# Patient Record
Sex: Female | Born: 1955 | Race: White | Hispanic: No | Smoking: Current every day smoker
Health system: Southern US, Community
[De-identification: ages and names within clinical notes are randomized; demographics above are authoritative.]

## PROBLEM LIST (undated history)

## (undated) DIAGNOSIS — E785 Hyperlipidemia, unspecified: Secondary | ICD-10-CM

## (undated) DIAGNOSIS — Z860100 Personal history of colon polyps, unspecified: Secondary | ICD-10-CM

## (undated) DIAGNOSIS — Z87898 Personal history of other specified conditions: Secondary | ICD-10-CM

## (undated) DIAGNOSIS — Z8719 Personal history of other diseases of the digestive system: Secondary | ICD-10-CM

## (undated) DIAGNOSIS — I10 Essential (primary) hypertension: Secondary | ICD-10-CM

## (undated) DIAGNOSIS — E119 Type 2 diabetes mellitus without complications: Secondary | ICD-10-CM

## (undated) DIAGNOSIS — F329 Major depressive disorder, single episode, unspecified: Secondary | ICD-10-CM

## (undated) DIAGNOSIS — F32A Depression, unspecified: Secondary | ICD-10-CM

## (undated) DIAGNOSIS — Z8249 Family history of ischemic heart disease and other diseases of the circulatory system: Secondary | ICD-10-CM

## (undated) DIAGNOSIS — Z8619 Personal history of other infectious and parasitic diseases: Secondary | ICD-10-CM

## (undated) DIAGNOSIS — Z8639 Personal history of other endocrine, nutritional and metabolic disease: Secondary | ICD-10-CM

## (undated) DIAGNOSIS — Z8659 Personal history of other mental and behavioral disorders: Secondary | ICD-10-CM

## (undated) HISTORY — DX: Personal history of colonic polyps: Z86.010

## (undated) HISTORY — DX: Personal history of other specified conditions: Z87.898

## (undated) HISTORY — DX: Type 2 diabetes mellitus without complications: E11.9

## (undated) HISTORY — DX: Family history of ischemic heart disease and other diseases of the circulatory system: Z82.49

## (undated) HISTORY — DX: Hyperlipidemia, unspecified: E78.5

## (undated) HISTORY — DX: Personal history of other diseases of the digestive system: Z87.19

## (undated) HISTORY — DX: Personal history of colon polyps, unspecified: Z86.0100

## (undated) HISTORY — DX: Essential (primary) hypertension: I10

## (undated) HISTORY — DX: Personal history of other mental and behavioral disorders: Z86.59

## (undated) HISTORY — DX: Major depressive disorder, single episode, unspecified: F32.9

## (undated) HISTORY — DX: Personal history of other endocrine, nutritional and metabolic disease: Z86.39

## (undated) HISTORY — DX: Depression, unspecified: F32.A

## (undated) HISTORY — DX: Personal history of other infectious and parasitic diseases: Z86.19

---

## 1998-08-14 HISTORY — PX: REDUCTION MAMMAPLASTY: SUR839

## 1998-08-14 HISTORY — PX: BREAST BIOPSY: SHX20

## 2007-08-15 HISTORY — PX: INCONTINENCE SURGERY: SHX676

## 2007-08-15 HISTORY — PX: PARTIAL HYSTERECTOMY: SHX80

## 2007-08-15 HISTORY — PX: GALLBLADDER SURGERY: SHX652

## 2008-08-14 HISTORY — PX: BREAST REDUCTION SURGERY: SHX8

## 2010-07-30 ENCOUNTER — Ambulatory Visit: Payer: Self-pay | Admitting: Internal Medicine

## 2015-08-16 LAB — HM PAP SMEAR: HM Pap smear: NORMAL

## 2017-06-24 ENCOUNTER — Telehealth: Payer: Self-pay | Admitting: Family Medicine

## 2017-06-24 NOTE — Telephone Encounter (Signed)
Copied from CRM [id number]. Topic: Appointment Scheduling - Scheduling Inquiry for Clinic >> Jun 20, 2017 11:29 AM Landry MellowFoltz, Melissa J wrote: Reason for CRM: pt called - she was told by Seth Bakerystal Sizemore that Dr Milinda Antisower would accept her as new patient.  This patient is a cousin to Crystal.  Cb number is [phone]. Pt has recently moved from [place] >> Jun 20, 2017 11:42 AM Shon MilletWatlington, Shapale M, CMA wrote: [facility] wants to make sure it's okay with you to schedule this new pt appt. See their note   Yes, that is ok  Thanks

## 2017-06-26 NOTE — Telephone Encounter (Signed)
I called pt and sch 08/20/17.

## 2017-08-20 ENCOUNTER — Ambulatory Visit: Payer: Self-pay | Admitting: Family Medicine

## 2017-08-31 ENCOUNTER — Ambulatory Visit: Payer: BLUE CROSS/BLUE SHIELD | Admitting: Family Medicine

## 2017-08-31 ENCOUNTER — Encounter: Payer: Self-pay | Admitting: Family Medicine

## 2017-08-31 VITALS — BP 110/76 | HR 76 | Temp 98.3°F | Ht 66.0 in | Wt 217.0 lb

## 2017-08-31 DIAGNOSIS — E669 Obesity, unspecified: Secondary | ICD-10-CM | POA: Diagnosis not present

## 2017-08-31 DIAGNOSIS — E1169 Type 2 diabetes mellitus with other specified complication: Secondary | ICD-10-CM | POA: Insufficient documentation

## 2017-08-31 DIAGNOSIS — E6609 Other obesity due to excess calories: Secondary | ICD-10-CM | POA: Insufficient documentation

## 2017-08-31 DIAGNOSIS — F17200 Nicotine dependence, unspecified, uncomplicated: Secondary | ICD-10-CM | POA: Diagnosis not present

## 2017-08-31 DIAGNOSIS — E7800 Pure hypercholesterolemia, unspecified: Secondary | ICD-10-CM

## 2017-08-31 DIAGNOSIS — F418 Other specified anxiety disorders: Secondary | ICD-10-CM

## 2017-08-31 DIAGNOSIS — G4700 Insomnia, unspecified: Secondary | ICD-10-CM | POA: Insufficient documentation

## 2017-08-31 DIAGNOSIS — Z1231 Encounter for screening mammogram for malignant neoplasm of breast: Secondary | ICD-10-CM

## 2017-08-31 DIAGNOSIS — I10 Essential (primary) hypertension: Secondary | ICD-10-CM

## 2017-08-31 DIAGNOSIS — E663 Overweight: Secondary | ICD-10-CM | POA: Insufficient documentation

## 2017-08-31 DIAGNOSIS — E785 Hyperlipidemia, unspecified: Secondary | ICD-10-CM | POA: Insufficient documentation

## 2017-08-31 DIAGNOSIS — F5101 Primary insomnia: Secondary | ICD-10-CM

## 2017-08-31 MED ORDER — ATORVASTATIN CALCIUM 20 MG PO TABS: 20.0000 mg | ORAL_TABLET | Freq: Every day | ORAL | 3 refills | Status: DC

## 2017-08-31 MED ORDER — PAROXETINE HCL 40 MG PO TABS: 80.0000 mg | ORAL_TABLET | Freq: Every day | ORAL | 3 refills | Status: DC

## 2017-08-31 MED ORDER — LOSARTAN POTASSIUM-HCTZ 100-25 MG PO TABS: 1.0000 | ORAL_TABLET | Freq: Every day | ORAL | 3 refills | Status: DC

## 2017-08-31 NOTE — Patient Instructions (Addendum)
Start changing diet  Begin with moving towards drinks without calories (mostly water)  Try to get most of your carbohydrates from produce (with the exception of white potatoes)  Eat less bread/pasta/rice/snack foods/cereals/sweets and other items from the middle of the grocery store (processed carbs)   We will refer you for a mammogram and have you sign a release for old records   Labs and f/u in 1 mo

## 2017-08-31 NOTE — Progress Notes (Signed)
Subjective:    Patient ID: Kathleen Hernandez, female    DOB: [birth year], 62 y.o.   MRN: [id number]  HPI Here to establish for care   From [facility]  She grew up here   Lost her husband 3 y ago  Doing ok - better to be home and around family   Smoker - about 55 y  About 1 ppd  No desire to quit  No copd or lung disease  No heart dz  Had her flu shot in the fall  Never had a pneumonia shot  ? Last tetanus shot  Has had the shingles-but no shot - interested in Shingrix    Hx of hysterectomy  Breast reduction  Bladder sling  ccy   Last colonoscopy 2014 - due for one in 2 years   Used to have acid reflux but better now (no longer on zantac)   Last pap 2017 Has had a hysterectomy  G1P1  Last  mammogram 11/17  Needs to get set up in [facility] lipitor for cholesterol as well as fish oil  Controls it well -last labs in July  Diet is not really healthy   Does not exercise  Busy but no extra  Does not work   Raising her 2 grandkids 10 and 15 - full time  Her son (their parent) - is on drugs    hyzaar for HTN  BP: 110/76  Well controlled  Is actually out of medicines  Last lab in July    Takes paxil -for both depression and anxiety  Out of it for a while  Feels terrible   Remus Loffler - she has to have it  Cannot sleep at all w/o it  Taking 2-3 years  She had tried melatonin (gave her bad dreams)  Did not try anything else   Generally does not feel good-no energy   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 Hyperlipidemia 08/31/2017  . Smoker 08/31/2017  . Hypertension 08/31/2017  . Encounter for screening mammogram for breast cancer 08/31/2017  . Obesity (BMI 30-39.9) 08/31/2017  . Depression with anxiety 08/31/2017  . Insomnia 08/31/2017   Past Medical History:  Diagnosis Date  . Family history of heart murmur   . History of chickenpox   . History  of colon polyps   . History of depression   . History of gastroesophageal reflux (GERD)   . History of hyperlipidemia   . History of urinary incontinence    Past Surgical History:  Procedure Laterality Date  . BREAST BIOPSY  2010  . BREAST REDUCTION SURGERY  2010  . GALLBLADDER SURGERY  2009  . INCONTINENCE SURGERY  2009  . PARTIAL HYSTERECTOMY  2009   Social History   Tobacco Use  . Smoking status: Current Every Day Smoker    Packs/day: 1.00  . Smokeless tobacco: Never Used  Substance Use Topics  . Alcohol use: No    Frequency: Never  . Drug use: No   Family History  Problem Relation Age of Onset  . Arthritis Mother   . Heart attack Mother   . Early death Mother   . Alcohol abuse Father   . Alzheimer's disease Father   . Non-Hodgkin's lymphoma Sister   . Depression Sister   . Alcohol abuse Brother   . COPD Brother   . Heart attack Brother   .  Hyperlipidemia Brother   . Diabetes Maternal Grandmother   . Alcohol abuse Maternal Grandfather   . Alzheimer's disease Paternal Grandmother   . Drug abuse Son    Allergies  Allergen Reactions  . Requip [Ropinirole Hcl] Other (See Comments)    Tongue swelling  . Chantix [Varenicline] Rash   Current Outpatient Medications on File Prior to Visit  Medication Sig Dispense Refill  . aspirin 325 MG tablet Take 325 mg by mouth daily.    . Cholecalciferol (VITAMIN D3) 2000 units TABS Take 1 tablet by mouth daily.    . diphenhydrAMINE (BENADRYL) 25 MG tablet Take 25 mg by mouth daily as needed.    . Omega-3 Fatty Acids (FISH OIL) 1200 MG CAPS Take 1 capsule by mouth daily.    . vitamin E 400 UNIT capsule Take 400 Units by mouth daily.    Marland Kitchen zolpidem (AMBIEN) 10 MG tablet Take 10 mg by mouth at bedtime as needed for sleep.     No current facility-administered medications on file prior to visit.     Review of Systems  Constitutional: Positive for fatigue. Negative for activity change, appetite change, fever and unexpected weight  change.  HENT: Negative for congestion, ear pain, rhinorrhea, sinus pressure and sore throat.   Eyes: Negative for pain, redness and visual disturbance.  Respiratory: Negative for cough, shortness of breath and wheezing.   Cardiovascular: Negative for chest pain and palpitations.  Gastrointestinal: Negative for abdominal pain, blood in stool, constipation and diarrhea.  Endocrine: Negative for polydipsia and polyuria.  Genitourinary: Negative for dysuria, frequency and urgency.  Musculoskeletal: Positive for arthralgias. Negative for back pain and myalgias.  Skin: Negative for pallor and rash.  Allergic/Immunologic: Negative for environmental allergies.  Neurological: Positive for light-headedness. Negative for dizziness, syncope and headaches.  Hematological: Negative for adenopathy. Does not bruise/bleed easily.  Psychiatric/Behavioral: Positive for dysphoric mood and sleep disturbance. Negative for decreased concentration, self-injury and suicidal ideas. The patient is nervous/anxious.        Objective:   Physical Exam  Constitutional: She appears well-developed and well-nourished. No distress.  obese and well appearing   HENT:  Head: Normocephalic and atraumatic.  Right Ear: External ear normal.  Left Ear: External ear normal.  Nose: Nose normal.  Mouth/Throat: Oropharynx is clear and moist.  Eyes: Conjunctivae and EOM are normal. Pupils are equal, round, and reactive to light. Right eye exhibits no discharge. Left eye exhibits no discharge. No scleral icterus.  Neck: Normal range of motion. Neck supple. No JVD present. Carotid bruit is not present. No thyromegaly present.  Cardiovascular: Normal rate, regular rhythm, normal heart sounds and intact distal pulses. Exam reveals no gallop.  Pulmonary/Chest: Effort normal and breath sounds normal. No respiratory distress. She has no wheezes. She has no rales.  Diffusely distant bs   Abdominal: Soft. Bowel sounds are normal. She  exhibits no distension and no mass. There is no tenderness.  Musculoskeletal: She exhibits no edema or tenderness.  Lymphadenopathy:    She has no cervical adenopathy.  Neurological: She is alert. She has normal reflexes. No cranial nerve deficit. She exhibits normal muscle tone. Coordination normal.  Skin: Skin is warm and dry. No rash noted. No erythema. No pallor.  Solar lentigines diffusely Some sks   Psychiatric: Her speech is normal and behavior is normal. Thought content normal. Her mood appears anxious. Her affect is not blunt, not labile and not inappropriate. She does not exhibit a depressed mood.  Pt is generally anxious /  pleasant and talkative           Assessment & Plan:   Problem List Items Addressed This Visit      Cardiovascular and Mediastinum   Hypertension    bp in fair control at this time  BP Readings from Last 1 Encounters:  08/31/17 110/76   No changes needed Disc lifstyle change with low sodium diet and exercise   Refilled losartan hct  Plan labs and f/u in 1 mo      Relevant Medications   aspirin 325 MG tablet   losartan-hydrochlorothiazide (HYZAAR) 100-25 MG tablet   atorvastatin (LIPITOR) 20 MG tablet     Other   Depression with anxiety - Primary    Pt has been out of paroxetine  Refilled today  Having w/d symptoms and inc dep/anx (no SI) Also lost spouse  Reviewed stressors/ coping techniques/symptoms/ support sources/ tx options and side effects in detail today  F/u 1 mo       Relevant Medications   PARoxetine (PAXIL) 40 MG tablet   Encounter for screening mammogram for breast cancer    Ref for mammogram  Enc self breast exams  Will be due for health mt in the summer       Relevant Orders   MM DIGITAL SCREENING BILATERAL   Hyperlipidemia    On atorvastatin  Sub optimal diet  Refilled today - out of it  Rev low sat/trans fat diet  F/u 1 mo lab and visit      Relevant Medications   aspirin 325 MG tablet    losartan-hydrochlorothiazide (HYZAAR) 100-25 MG tablet   atorvastatin (LIPITOR) 20 MG tablet   Insomnia    She has needed ambien for over a year  ?if anx related  Disc sleep hygiene        Obesity (BMI 30-39.9)    Discussed how this problem influences overall health and the risks it imposes  Reviewed plan for weight loss with lower calorie diet (via better food choices and also portion control or program like weight watchers) and exercise building up to or more than 30 minutes 5 days per week including some aerobic activity   Pt plans to stop sugar beverages and work on lower glycemic diet       Smoker    Disc in detail risks of smoking and possible outcomes including copd, vascular/ heart disease, cancer , respiratory and sinus infections  Pt voices understanding She is not interested in quitting at this time

## 2017-09-02 NOTE — Assessment & Plan Note (Signed)
Pt has been out of paroxetine  Refilled today  Having w/d symptoms and inc dep/anx (no SI) Also lost spouse  Reviewed stressors/ coping techniques/symptoms/ support sources/ tx options and side effects in detail today  F/u 1 mo

## 2017-09-02 NOTE — Assessment & Plan Note (Signed)
Disc in detail risks of smoking and possible outcomes including copd, vascular/ heart disease, cancer , respiratory and sinus infections  Pt voices understanding She is not interested in quitting at this time  

## 2017-09-02 NOTE — Assessment & Plan Note (Signed)
Discussed how this problem influences overall health and the risks it imposes  Reviewed plan for weight loss with lower calorie diet (via better food choices and also portion control or program like weight watchers) and exercise building up to or more than 30 minutes 5 days per week including some aerobic activity   Pt plans to stop sugar beverages and work on lower glycemic diet

## 2017-09-02 NOTE — Assessment & Plan Note (Signed)
Ref for mammogram  Enc self breast exams  Will be due for health mt in the summer

## 2017-09-02 NOTE — Assessment & Plan Note (Signed)
On atorvastatin  Sub optimal diet  Refilled today - out of it  Rev low sat/trans fat diet  F/u 1 mo lab and visit

## 2017-09-02 NOTE — Assessment & Plan Note (Signed)
bp in fair control at this time  BP Readings from Last 1 Encounters:  08/31/17 110/76   No changes needed Disc lifstyle change with low sodium diet and exercise   Refilled losartan hct  Plan labs and f/u in 1 mo

## 2017-09-02 NOTE — Assessment & Plan Note (Signed)
She has needed ambien for over a year  ?if anx related  Disc sleep hygiene

## 2017-09-16 ENCOUNTER — Emergency Department: Payer: BLUE CROSS/BLUE SHIELD

## 2017-09-16 ENCOUNTER — Encounter: Payer: Self-pay | Admitting: Emergency Medicine

## 2017-09-16 ENCOUNTER — Other Ambulatory Visit: Payer: Self-pay

## 2017-09-16 ENCOUNTER — Emergency Department
Admission: EM | Admit: 2017-09-16 | Discharge: 2017-09-16 | Disposition: A | Discharge status: Home / Self Care | Payer: BLUE CROSS/BLUE SHIELD | Attending: Emergency Medicine | Admitting: Emergency Medicine

## 2017-09-16 DIAGNOSIS — F17210 Nicotine dependence, cigarettes, uncomplicated: Secondary | ICD-10-CM | POA: Insufficient documentation

## 2017-09-16 DIAGNOSIS — Z79899 Other long term (current) drug therapy: Secondary | ICD-10-CM | POA: Insufficient documentation

## 2017-09-16 DIAGNOSIS — Z7982 Long term (current) use of aspirin: Secondary | ICD-10-CM | POA: Insufficient documentation

## 2017-09-16 DIAGNOSIS — R52 Pain, unspecified: Secondary | ICD-10-CM | POA: Diagnosis present

## 2017-09-16 DIAGNOSIS — J101 Influenza due to other identified influenza virus with other respiratory manifestations: Secondary | ICD-10-CM

## 2017-09-16 DIAGNOSIS — I10 Essential (primary) hypertension: Secondary | ICD-10-CM | POA: Diagnosis not present

## 2017-09-16 LAB — CBC
HCT: 45.8 % (ref 35.0–47.0)
Hemoglobin: 15.7 g/dL (ref 12.0–16.0)
MCH: 30.8 pg (ref 26.0–34.0)
MCHC: 34.2 g/dL (ref 32.0–36.0)
MCV: 90 fL (ref 80.0–100.0)
PLATELETS: 296 10*3/uL (ref 150–440)
RBC: 5.09 MIL/uL (ref 3.80–5.20)
RDW: 12.6 % (ref 11.5–14.5)
WBC: 5.3 10*3/uL (ref 3.6–11.0)

## 2017-09-16 LAB — COMPREHENSIVE METABOLIC PANEL
ALBUMIN: 4 g/dL (ref 3.5–5.0)
ALT: 23 U/L (ref 14–54)
AST: 33 U/L (ref 15–41)
Alkaline Phosphatase: 97 U/L (ref 38–126)
Anion gap: 12 (ref 5–15)
BUN: 27 mg/dL — ABNORMAL HIGH (ref 6–20)
CHLORIDE: 95 mmol/L — AB (ref 101–111)
CO2: 24 mmol/L (ref 22–32)
CREATININE: 1.4 mg/dL — AB (ref 0.44–1.00)
Calcium: 9.2 mg/dL (ref 8.9–10.3)
GFR calc non Af Amer: 40 mL/min — ABNORMAL LOW (ref >60)
GFR, EST AFRICAN AMERICAN: 46 mL/min — AB (ref >60)
GLUCOSE: 114 mg/dL — AB (ref 65–99)
Potassium: 3.3 mmol/L — ABNORMAL LOW (ref 3.5–5.1)
SODIUM: 131 mmol/L — AB (ref 135–145)
Total Bilirubin: 0.5 mg/dL (ref 0.3–1.2)
Total Protein: 7.6 g/dL (ref 6.5–8.1)

## 2017-09-16 LAB — INFLUENZA PANEL BY PCR (TYPE A & B)
INFLAPCR: POSITIVE — AB
INFLBPCR: NEGATIVE

## 2017-09-16 MED ORDER — ONDANSETRON 4 MG PO TBDP: 4.0000 mg | ORAL_TABLET | Freq: Three times a day (TID) | ORAL | 0 refills | Status: DC | PRN

## 2017-09-16 MED ORDER — SODIUM CHLORIDE 0.9 % IV SOLN
1000.0000 mL | Freq: Once | INTRAVENOUS | Status: AC
  Administered 2017-09-16: 1000 mL via INTRAVENOUS

## 2017-09-16 MED ORDER — ONDANSETRON HCL 4 MG/2ML IJ SOLN
INTRAMUSCULAR | Status: AC
  Administered 2017-09-16: 4 mg via INTRAVENOUS
  Filled 2017-09-16: qty 2

## 2017-09-16 MED ORDER — ONDANSETRON HCL 4 MG/2ML IJ SOLN
4.0000 mg | Freq: Once | INTRAMUSCULAR | Status: AC
  Administered 2017-09-16: 4 mg via INTRAVENOUS

## 2017-09-16 NOTE — ED Provider Notes (Signed)
[facility] Emergency Department Provider Note   ____________________________________________    I have reviewed the triage vital signs and the nursing notes.   HISTORY  Chief Complaint Generalized Body Aches; Nausea; and Diarrhea     HPI Kathleen Hernandez is a 62 y.o. female presents with complaints of body aches, nausea vomiting and diarrhea over the last 3 days.  Symptoms apparently started on Thursday where she started to feel fatigued with body aches primarily in her back.  She developed nausea and vomiting on Friday and Saturday.  Today she feels "wiped out ".  She has not taken anything for this except for acetaminophen with little improvement.  She has had fevers at home.  She did receive a flu shot this year.  No recent travel.  No sick contacts reported   Past Medical History:  Diagnosis Date  . Family history of heart murmur   . History of chickenpox   . History of colon polyps   . History of depression   . History of gastroesophageal reflux (GERD)   . History of hyperlipidemia   . History of urinary incontinence     Patient Active Problem List   Diagnosis Date Noted  . Hyperlipidemia 08/31/2017  . Smoker 08/31/2017  . Hypertension 08/31/2017  . Encounter for screening mammogram for breast cancer 08/31/2017  . Obesity (BMI 30-39.9) 08/31/2017  . Depression with anxiety 08/31/2017  . Insomnia 08/31/2017    Past Surgical History:  Procedure Laterality Date  . BREAST BIOPSY  2010  . BREAST REDUCTION SURGERY  2010  . GALLBLADDER SURGERY  2009  . INCONTINENCE SURGERY  2009  . PARTIAL HYSTERECTOMY  2009    Prior to Admission medications   Medication Sig Start Date End Date Taking? Authorizing Provider  aspirin 325 MG tablet Take 325 mg by mouth daily.    [provider]  atorvastatin (LIPITOR) 20 MG tablet Take 1 tablet (20 mg total) by mouth daily. 08/31/17   Tower, Audrie Gallus, MD  Cholecalciferol (VITAMIN D3) 2000 units  TABS Take 1 tablet by mouth daily.    [provider]  diphenhydrAMINE (BENADRYL) 25 MG tablet Take 25 mg by mouth daily as needed.    [provider]  losartan-hydrochlorothiazide (HYZAAR) 100-25 MG tablet Take 1 tablet by mouth daily. 08/31/17   Tower, Audrie Gallus, MD  Omega-3 Fatty Acids (FISH OIL) 1200 MG CAPS Take 1 capsule by mouth daily.    [provider]  ondansetron (ZOFRAN ODT) 4 MG disintegrating tablet Take 1 tablet (4 mg total) by mouth every 8 (eight) hours as needed for nausea or vomiting. 09/16/17   Jene Every, MD  PARoxetine (PAXIL) 40 MG tablet Take 2 tablets (80 mg total) by mouth daily. 08/31/17   Tower, Audrie Gallus, MD  vitamin E 400 UNIT capsule Take 400 Units by mouth daily.    [provider]  zolpidem (AMBIEN) 10 MG tablet Take 10 mg by mouth at bedtime as needed for sleep.    [provider]     Allergies Requip [ropinirole hcl] and Chantix [varenicline]  Family History  Problem Relation Age of Onset  . Arthritis Mother   . Heart attack Mother   . Early death Mother   . Alcohol abuse Father   . Alzheimer's disease Father   . Non-Hodgkin's lymphoma Sister   . Depression Sister   . Alcohol abuse Brother   . COPD Brother   . Heart attack Brother   .  Hyperlipidemia Brother   . Diabetes Maternal Grandmother   . Alcohol abuse Maternal Grandfather   . Alzheimer's disease Paternal Grandmother   . Drug abuse Son     Social History Social History   Tobacco Use  . Smoking status: Current Every Day Smoker    Packs/day: 1.00  . Smokeless tobacco: Never Used  Substance Use Topics  . Alcohol use: No    Frequency: Never  . Drug use: No    Review of Systems  Constitutional: Positive fevers Eyes: No visual changes.  ENT: No sore throat. Cardiovascular: Denies chest pain. Respiratory: Positive cough Gastrointestinal: Nausea vomiting and diarrhea as above Genitourinary: Negative for dysuria. Musculoskeletal: Positive  myalgias Skin: Negative for rash. Neurological: Positive headache   ____________________________________________   PHYSICAL EXAM:  VITAL SIGNS: ED Triage Vitals  Enc Vitals Group     BP 09/16/17 1246 92/67     Pulse Rate 09/16/17 1246 88     Resp 09/16/17 1246 20     Temp 09/16/17 1246 97.8 F (36.6 C)     Temp Source 09/16/17 1246 Oral     SpO2 09/16/17 1246 95 %     Weight 09/16/17 1247 97.5 kg (215 lb)     Height 09/16/17 1247 1.676 m (5\' 6" )     Head Circumference --      Peak Flow --      Pain Score 09/16/17 1247 9     Pain Loc --      Pain Edu? --      Excl. in GC? --     Constitutional: Alert and oriented. No acute distress.  Eyes: Conjunctivae are normal.   Nose: No congestion/rhinnorhea. Mouth/Throat: Mucous membranes are moist.    Cardiovascular: Normal rate, regular rhythm. Grossly normal heart sounds.  Good peripheral circulation. Respiratory: Normal respiratory effort.  No retractions. Lungs CTAB. Gastrointestinal: Soft and nontender. No distention.   Genitourinary: deferred Musculoskeletal: No lower extremity tenderness nor edema.  Warm and well perfused Neurologic:  Normal speech and language. No gross focal neurologic deficits are appreciated.  Skin:  Skin is warm, dry and intact. No rash noted. Psychiatric: Mood and affect are normal. Speech and behavior are normal.  ____________________________________________   LABS (all labs ordered are listed, but only abnormal results are displayed)  Labs Reviewed  INFLUENZA PANEL BY PCR (TYPE A & B) - Abnormal; Notable for the following components:      Result Value   Influenza A By PCR POSITIVE (*)    All other components within normal limits  COMPREHENSIVE METABOLIC PANEL - Abnormal; Notable for the following components:   Sodium 131 (*)    Potassium 3.3 (*)    Chloride 95 (*)    Glucose, Bld 114 (*)    BUN 27 (*)    Creatinine, Ser 1.40 (*)    GFR calc non Af Amer 40 (*)    GFR calc Af Amer 46  (*)    All other components within normal limits  CBC   ____________________________________________  EKG  None ____________________________________________  RADIOLOGY  Chest x-ray negative for pneumonia ____________________________________________   PROCEDURES  Procedure(s) performed: No  Procedures   Critical Care performed: No ____________________________________________   INITIAL IMPRESSION / ASSESSMENT AND PLAN / ED COURSE  Pertinent labs & imaging results that were available during my care of the patient were reviewed by me and considered in my medical decision making (see chart for details).  Patient presents with symptoms as noted above, suspicious for viral  illness, possibly influenza.  Chest x-ray is reassuring, overall exam is also reassuring.  We will check labs, give IV fluids, IV Zofran, check influenza and reevaluate  Clinical Course as of Sep 16 1544  Sun Sep 16, 2017  1438 Influenza A By PCR: (!) POSITIVE [RK]    Clinical Course User Index [RK] Jene Every, MD    Patient felt significantly better after fluids, lab work shows positive influenza as well as some dehydration ____________________________________________   FINAL CLINICAL IMPRESSION(S) / ED DIAGNOSES  Final diagnoses:  Influenza A        Note:  This document was prepared using Dragon voice recognition software and may include unintentional dictation errors.    Jene Every, MD 09/16/17 [phone]

## 2017-09-16 NOTE — ED Triage Notes (Signed)
Presents to ER from home with complaints of generalized body aches, fever at home as high as 101F, nausea this morning no episodes of vomiting , diarrhea all day about 5 episodes today . Unable to eat or drink, dry cough, reports upper back pain.

## 2017-09-16 NOTE — ED Notes (Signed)
Discussed with Dr. Fanny BienQuale about pt's presentation received verbal order to get a chest X-ray. RN acknowledge orders.

## 2017-09-16 NOTE — ED Notes (Signed)
Patient to lobby in wheelchair. Verbalized understanding of discharge instructions and follow-up care.  

## 2017-09-21 ENCOUNTER — Ambulatory Visit
Admission: RE | Admit: 2017-09-21 | Discharge: 2017-09-21 | Disposition: A | Discharge status: Home / Self Care | Payer: BLUE CROSS/BLUE SHIELD | Source: Ambulatory Visit | Attending: Family Medicine | Admitting: Family Medicine

## 2017-09-21 DIAGNOSIS — Z1231 Encounter for screening mammogram for malignant neoplasm of breast: Secondary | ICD-10-CM | POA: Diagnosis present

## 2017-09-26 ENCOUNTER — Encounter: Payer: Self-pay | Admitting: Family Medicine

## 2017-09-26 ENCOUNTER — Ambulatory Visit: Payer: BLUE CROSS/BLUE SHIELD | Admitting: Family Medicine

## 2017-09-26 ENCOUNTER — Ambulatory Visit (INDEPENDENT_AMBULATORY_CARE_PROVIDER_SITE_OTHER)
Admission: RE | Admit: 2017-09-26 | Discharge: 2017-09-26 | Disposition: A | Discharge status: Home / Self Care | Payer: BLUE CROSS/BLUE SHIELD | Source: Ambulatory Visit | Attending: Family Medicine | Admitting: Family Medicine

## 2017-09-26 ENCOUNTER — Other Ambulatory Visit: Payer: Self-pay

## 2017-09-26 VITALS — BP 100/70 | HR 63 | Temp 98.6°F | Ht 66.0 in | Wt 211.5 lb

## 2017-09-26 DIAGNOSIS — M25562 Pain in left knee: Secondary | ICD-10-CM

## 2017-09-26 NOTE — Progress Notes (Signed)
Dr. Karleen HampshireSpencer T. Analysa Nutting, MD, CAQ Sports [facility] [address] [place], [id number] Phone: [phone] Fax: [phone]  09/26/2017  Patient: Kathleen Hernandez Brubeck, MRN: [id number], DOB: 12/06/1955, 62 y.o.  Primary Physician:  Tower, Audrie GallusMarne A, MD   Chief Complaint  Patient presents with  . Fall    Missed stair and came down on knee 2 weeks ago  . Knee Pain    Left   Subjective:   Kathleen Hernandez Fannin is Hernandez 62 y.o. very pleasant female patient who presents with the following:  2 weeks ago, came down on concrete. Down 1 stair.  At that point she struck the anterior aspect of her knee.  She is walking reasonably okay, but that she initially did have some limp.  She does have some bruising, more on the lateral aspect of the knee.  No prior history of operative intervention in the affected knee.  Past Medical History, Surgical History, Social History, Family History, Problem List, Medications, and Allergies have been reviewed and updated if relevant.  Patient Active Problem List   Diagnosis Date Noted  . Hyperlipidemia 08/31/2017  . Smoker 08/31/2017  . Hypertension 08/31/2017  . Encounter for screening mammogram for breast cancer 08/31/2017  . Obesity (BMI 30-39.9) 08/31/2017  . Depression with anxiety 08/31/2017  . Insomnia 08/31/2017    Past Medical History:  Diagnosis Date  . Family history of heart murmur   . History of chickenpox   . History of colon polyps   . History of depression   . History of gastroesophageal reflux (GERD)   . History of hyperlipidemia   . History of urinary incontinence     Past Surgical History:  Procedure Laterality Date  . BREAST BIOPSY Right 2000   neg  . BREAST REDUCTION SURGERY  2010  . GALLBLADDER SURGERY  2009  . INCONTINENCE SURGERY  2009  . PARTIAL HYSTERECTOMY  2009  . REDUCTION MAMMAPLASTY Bilateral 2000    Social History   Socioeconomic History  . Marital status: Married    Spouse name:  Not on file  . Number of children: Not on file  . Years of education: Not on file  . Highest education level: Not on file  Social Needs  . Financial resource strain: Not on file  . Food insecurity - worry: Not on file  . Food insecurity - inability: Not on file  . Transportation needs - medical: Not on file  . Transportation needs - non-medical: Not on file  Occupational History  . Not on file  Tobacco Use  . Smoking status: Current Every Day Smoker    Packs/day: 1.00  . Smokeless tobacco: Never Used  Substance and Sexual Activity  . Alcohol use: No    Frequency: Never  . Drug use: No  . Sexual activity: Not on file  Other Topics Concern  . Not on file  Social History Narrative  . Not on file    Family History  Problem Relation Age of Onset  . Arthritis Mother   . Heart attack Mother   . Early death Mother   . Alcohol abuse Father   . Alzheimer's disease Father   . Non-Hodgkin's lymphoma Sister   . Depression Sister   . Alcohol abuse Brother   . COPD Brother   . Heart attack Brother   . Hyperlipidemia Brother   . Diabetes Maternal Grandmother   . Alcohol abuse Maternal Grandfather   . Alzheimer's disease Paternal Grandmother   .  Drug abuse Son     Allergies  Allergen Reactions  . Requip [Ropinirole Hcl] Other (See Comments)    Tongue swelling  . Chantix [Varenicline] Rash    Medication list reviewed and updated in full in [facility].  GEN: No fevers, chills. Nontoxic. Primarily MSK c/o today. MSK: Detailed in the HPI GI: tolerating PO intake without difficulty Neuro: No numbness, parasthesias, or tingling associated. Otherwise the pertinent positives of the ROS are noted above.   Objective:   BP 100/70   Pulse 63   Temp 98.6 F (37 C) (Oral)   Ht 5\' 6"  (1.676 m)   Wt 211 lb 8 oz (95.9 kg)   BMI 34.14 kg/m    GEN: WDWN, NAD, Non-toxic, Alert & Oriented x 3 HEENT: Atraumatic, Normocephalic.  Ears and Nose: No external deformity. EXTR: No  clubbing/cyanosis/edema NEURO: Normal gait.  PSYCH: Normally interactive. Conversant. Not depressed or anxious appearing.  Calm demeanor.    Left knee: Patient has full extension and flexion 225 degrees.  There is no significant effusion.  Nontender at the patella facets.  Negative apprehension.  MCL and LCL are stable.  ACL and PCL are intact.  No significant medial joint line tenderness.  The patient does have some modest lateral joint line tenderness as well as some pain at the lateral knee to palpation.  No significant tenderness at the proximal tibia, tibial plateau, or the proximal for  Radiology: Dg Knee Complete 4 Views Left  Result Date: 09/26/2017 CLINICAL DATA:  Recent fall with left knee pain, initial encounter EXAM: LEFT KNEE - COMPLETE 4+ VIEW COMPARISON:  None. FINDINGS: No evidence of fracture, dislocation, or joint effusion. No evidence of arthropathy or other focal bone abnormality. Soft tissues are unremarkable. IMPRESSION: No acute abnormality noted. Electronically Signed   By: Alcide Clever M.D.   On: 09/26/2017 09:11   Assessment and Plan:   Acute pain of left knee - Plan: DG Knee Complete 4 Views Left  Most likely traumatic bone contusion, with possible meniscal contusion.  Good tissue with some intermittent icing, over-the-counter Tylenol or Advil is perfectly fine.  I gave her Hernandez patellar J brace for stability when ambulating.  I am going to recheck her in the office in 3 weeks.  Follow-up: Return in about 3 weeks (around 10/17/2017).  Medications Discontinued During This Encounter  Medication Reason  . ondansetron (ZOFRAN ODT) 4 MG disintegrating tablet No longer needed (for PRN medications)   Orders Placed This Encounter  Procedures  . DG Knee Complete 4 Views Left    Signed,  Katisha Shimizu T. Sheyli Horwitz, MD   Allergies as of 09/26/2017      Reactions   Requip [ropinirole Hcl] Other (See Comments)   Tongue swelling   Chantix [varenicline] Rash      Medication  List        Accurate as of 09/26/17 11:59 PM. Always use your most recent med list.          aspirin 325 MG tablet Take 325 mg by mouth daily.   atorvastatin 20 MG tablet Commonly known as:  LIPITOR Take 1 tablet (20 mg total) by mouth daily.   diphenhydrAMINE 25 MG tablet Commonly known as:  BENADRYL Take 25 mg by mouth daily as needed.   Fish Oil 1200 MG Caps Take 1 capsule by mouth daily.   losartan-hydrochlorothiazide 100-25 MG tablet Commonly known as:  HYZAAR Take 1 tablet by mouth daily.   PARoxetine 40 MG tablet Commonly known  as:  PAXIL Take 2 tablets (80 mg total) by mouth daily.   Vitamin D3 2000 units Tabs Take 1 tablet by mouth daily.   vitamin E 400 UNIT capsule Take 400 Units by mouth daily.   zolpidem 10 MG tablet Commonly known as:  AMBIEN Take 10 mg by mouth at bedtime as needed for sleep.

## 2017-10-01 ENCOUNTER — Other Ambulatory Visit (INDEPENDENT_AMBULATORY_CARE_PROVIDER_SITE_OTHER): Payer: BLUE CROSS/BLUE SHIELD

## 2017-10-01 DIAGNOSIS — I10 Essential (primary) hypertension: Secondary | ICD-10-CM

## 2017-10-01 DIAGNOSIS — E7800 Pure hypercholesterolemia, unspecified: Secondary | ICD-10-CM

## 2017-10-01 LAB — COMPREHENSIVE METABOLIC PANEL
ALT: 10 U/L (ref 0–35)
AST: 9 U/L (ref 0–37)
Albumin: 3.9 g/dL (ref 3.5–5.2)
Alkaline Phosphatase: 95 U/L (ref 39–117)
BUN: 18 mg/dL (ref 6–23)
CO2: 30 meq/L (ref 19–32)
Calcium: 8.9 mg/dL (ref 8.4–10.5)
Chloride: 102 mEq/L (ref 96–112)
Creatinine, Ser: 0.74 mg/dL (ref 0.40–1.20)
GFR: 84.67 mL/min (ref >60.00)
GLUCOSE: 111 mg/dL — AB (ref 70–99)
POTASSIUM: 4.3 meq/L (ref 3.5–5.1)
Sodium: 139 mEq/L (ref 135–145)
Total Bilirubin: 0.3 mg/dL (ref 0.2–1.2)
Total Protein: 6.8 g/dL (ref 6.0–8.3)

## 2017-10-01 LAB — CBC WITH DIFFERENTIAL/PLATELET
Basophils Absolute: 0.1 10*3/uL (ref 0.0–0.1)
Basophils Relative: 0.6 % (ref 0.0–3.0)
EOS PCT: 2.7 % (ref 0.0–5.0)
Eosinophils Absolute: 0.3 10*3/uL (ref 0.0–0.7)
HCT: 41.5 % (ref 36.0–46.0)
Hemoglobin: 13.9 g/dL (ref 12.0–15.0)
LYMPHS ABS: 2.2 10*3/uL (ref 0.7–4.0)
Lymphocytes Relative: 23.6 % (ref 12.0–46.0)
MCHC: 33.5 g/dL (ref 30.0–36.0)
MCV: 92.7 fl (ref 78.0–100.0)
Monocytes Absolute: 0.6 10*3/uL (ref 0.1–1.0)
Monocytes Relative: 6.8 % (ref 3.0–12.0)
NEUTROS ABS: 6.3 10*3/uL (ref 1.4–7.7)
NEUTROS PCT: 66.3 % (ref 43.0–77.0)
PLATELETS: 491 10*3/uL — AB (ref 150.0–400.0)
RBC: 4.48 Mil/uL (ref 3.87–5.11)
RDW: 12.9 % (ref 11.5–15.5)
WBC: 9.4 10*3/uL (ref 4.0–10.5)

## 2017-10-01 LAB — LIPID PANEL
CHOL/HDL RATIO: 3
Cholesterol: 145 mg/dL (ref 0–200)
HDL: 44.9 mg/dL (ref >39.00)
LDL Cholesterol: 79 mg/dL (ref 0–99)
NonHDL: 100.37
Triglycerides: 106 mg/dL (ref 0.0–149.0)
VLDL: 21.2 mg/dL (ref 0.0–40.0)

## 2017-10-01 LAB — TSH: TSH: 3.17 u[IU]/mL (ref 0.35–4.50)

## 2017-10-08 ENCOUNTER — Ambulatory Visit: Payer: BLUE CROSS/BLUE SHIELD | Admitting: Family Medicine

## 2017-10-08 ENCOUNTER — Encounter: Payer: Self-pay | Admitting: Family Medicine

## 2017-10-08 VITALS — BP 126/68 | HR 73 | Temp 97.5°F | Ht 66.0 in | Wt 213.0 lb

## 2017-10-08 DIAGNOSIS — F418 Other specified anxiety disorders: Secondary | ICD-10-CM | POA: Diagnosis not present

## 2017-10-08 DIAGNOSIS — R7309 Other abnormal glucose: Secondary | ICD-10-CM

## 2017-10-08 DIAGNOSIS — R7989 Other specified abnormal findings of blood chemistry: Secondary | ICD-10-CM

## 2017-10-08 DIAGNOSIS — I10 Essential (primary) hypertension: Secondary | ICD-10-CM | POA: Diagnosis not present

## 2017-10-08 DIAGNOSIS — Z23 Encounter for immunization: Secondary | ICD-10-CM

## 2017-10-08 DIAGNOSIS — E669 Obesity, unspecified: Secondary | ICD-10-CM | POA: Diagnosis not present

## 2017-10-08 DIAGNOSIS — F17200 Nicotine dependence, unspecified, uncomplicated: Secondary | ICD-10-CM

## 2017-10-08 DIAGNOSIS — E7800 Pure hypercholesterolemia, unspecified: Secondary | ICD-10-CM

## 2017-10-08 NOTE — Assessment & Plan Note (Signed)
Disc in detail risks of smoking and possible outcomes including copd, vascular/ heart disease, cancer , respiratory and sinus infections  Pt voices understanding She does not have the desire to quit

## 2017-10-08 NOTE — Assessment & Plan Note (Signed)
At 491 No symptoms /no bleeding or bruising  May be related to smoking Nl cbc otherwise Will watch  Re check at f/u

## 2017-10-08 NOTE — Assessment & Plan Note (Signed)
bp in fair control at this time  BP Readings from Last 1 Encounters:  10/08/17 126/68   No changes needed Disc lifstyle change with low sodium diet and exercise  Labs reviewed  Continue losartan hct

## 2017-10-08 NOTE — Assessment & Plan Note (Signed)
Discussed how this problem influences overall health and the risks it imposes  Reviewed plan for weight loss with lower calorie diet (via better food choices and also portion control or program like weight watchers) and exercise building up to or more than 30 minutes 5 days per week including some aerobic activity    

## 2017-10-08 NOTE — Assessment & Plan Note (Signed)
Much improved after re starting ssri

## 2017-10-08 NOTE — Assessment & Plan Note (Signed)
Disc goals for lipids and reasons to control them Rev labs with pt Rev low sat fat diet in detail Good control with atrovastatin and diet

## 2017-10-08 NOTE — Progress Notes (Signed)
Subjective:    Patient ID: Kathleen Hernandez, female    DOB: Jul 26, 1956, 62 y.o.   MRN: [id number]  HPI Here for f/u of mood and other chronic problems   Feeling good   Wt Readings from Last 3 Encounters:  10/08/17 213 lb (96.6 kg)  09/26/17 211 lb 8 oz (95.9 kg)  09/16/17 215 lb (97.5 kg)  wt is up 2lb  34.38 kg/m   Smoking status  No change  No desire to quit  No lung problems or couh   bp is stable today  No cp or palpitations or headaches or edema  No side effects to medicines  BP Readings from Last 3 Encounters:  10/08/17 126/68  09/26/17 100/70  09/16/17 100/61     Taking losartan/hct  Lab Results  Component Value Date   CREATININE 0.74 10/01/2017   BUN 18 10/01/2017   NA 139 10/01/2017   K 4.3 10/01/2017   CL 102 10/01/2017   CO2 30 10/01/2017   Lab Results  Component Value Date   ALT 10 10/01/2017   AST 9 10/01/2017   ALKPHOS 95 10/01/2017   BILITOT 0.3 10/01/2017   Lab Results  Component Value Date   WBC 9.4 10/01/2017   HGB 13.9 10/01/2017   HCT 41.5 10/01/2017   MCV 92.7 10/01/2017   PLT 491.0 (H) 10/01/2017   glucose 111  She dropped tea and soda - drinks almost all water occ coffee  Getting used to drinking water   Lab Results  Component Value Date   TSH 3.17 10/01/2017     Hyperlipidemia Lab Results  Component Value Date   CHOL 145 10/01/2017   HDL 44.90 10/01/2017   LDLCALC 79 10/01/2017   TRIG 106.0 10/01/2017   CHOLHDL 3 10/01/2017   On atorvastatin and diet  Staying at goal   Mood= depression and anxiety  Started back on paxil at last visit -much better /getting back to her old self  [facility] for sleep  Will get Tdap today   Had mammogram 2 weeks ago  No lumps on self exam   Gets colonoscopy every 5 years  Will send for these reports  Patient Active Problem List   Diagnosis Date Noted  . Elevated glucose level 10/08/2017  . Elevated platelet count 10/08/2017  . Hyperlipidemia 08/31/2017  . Smoker  08/31/2017  . Hypertension 08/31/2017  . Encounter for screening mammogram for breast cancer 08/31/2017  . Obesity (BMI 30-39.9) 08/31/2017  . Depression with anxiety 08/31/2017  . Insomnia 08/31/2017   Past Medical History:  Diagnosis Date  . Family history of heart murmur   . History of chickenpox   . History of colon polyps   . History of depression   . History of gastroesophageal reflux (GERD)   . History of hyperlipidemia   . History of urinary incontinence    Past Surgical History:  Procedure Laterality Date  . BREAST BIOPSY Right 2000   neg  . BREAST REDUCTION SURGERY  2010  . GALLBLADDER SURGERY  2009  . INCONTINENCE SURGERY  2009  . PARTIAL HYSTERECTOMY  2009  . REDUCTION MAMMAPLASTY Bilateral 2000   Social History   Tobacco Use  . Smoking status: Current Every Day Smoker    Packs/day: 1.00  . Smokeless tobacco: Never Used  Substance Use Topics  . Alcohol use: No    Frequency: Never  . Drug use: No   Family History  Problem Relation Age of Onset  . Arthritis Mother   .  Heart attack Mother   . Early death Mother   . Alcohol abuse Father   . Alzheimer's disease Father   . Non-Hodgkin's lymphoma Sister   . Depression Sister   . Alcohol abuse Brother   . COPD Brother   . Heart attack Brother   . Hyperlipidemia Brother   . Diabetes Maternal Grandmother   . Alcohol abuse Maternal Grandfather   . Alzheimer's disease Paternal Grandmother   . Drug abuse Son    Allergies  Allergen Reactions  . Requip [Ropinirole Hcl] Other (See Comments)    Tongue swelling  . Chantix [Varenicline] Rash   Current Outpatient Medications on File Prior to Visit  Medication Sig Dispense Refill  . aspirin 325 MG tablet Take 325 mg by mouth daily.    Marland Kitchen atorvastatin (LIPITOR) 20 MG tablet Take 1 tablet (20 mg total) by mouth daily. 90 tablet 3  . Cholecalciferol (VITAMIN D3) 2000 units TABS Take 1 tablet by mouth daily.    . diphenhydrAMINE (BENADRYL) 25 MG tablet Take 25 mg  by mouth daily as needed.    Marland Kitchen losartan-hydrochlorothiazide (HYZAAR) 100-25 MG tablet Take 1 tablet by mouth daily. 90 tablet 3  . Omega-3 Fatty Acids (FISH OIL) 1200 MG CAPS Take 1 capsule by mouth daily.    Marland Kitchen PARoxetine (PAXIL) 40 MG tablet Take 2 tablets (80 mg total) by mouth daily. 180 tablet 3  . vitamin E 400 UNIT capsule Take 400 Units by mouth daily.    Marland Kitchen zolpidem (AMBIEN) 10 MG tablet Take 10 mg by mouth at bedtime as needed for sleep.     No current facility-administered medications on file prior to visit.      Review of Systems  Constitutional: Negative for activity change, appetite change, fatigue, fever and unexpected weight change.  HENT: Negative for congestion, rhinorrhea, sore throat and trouble swallowing.   Eyes: Negative for pain, redness, itching and visual disturbance.  Respiratory: Negative for cough, chest tightness, shortness of breath and wheezing.   Cardiovascular: Negative for chest pain and palpitations.  Gastrointestinal: Negative for abdominal pain, blood in stool, constipation, diarrhea and nausea.  Endocrine: Negative for cold intolerance, heat intolerance, polydipsia and polyuria.  Genitourinary: Negative for difficulty urinating, dysuria, frequency and urgency.  Musculoskeletal: Negative for arthralgias, joint swelling and myalgias.  Skin: Negative for pallor and rash.  Neurological: Negative for dizziness, tremors, weakness, numbness and headaches.  Hematological: Negative for adenopathy. Does not bruise/bleed easily.  Psychiatric/Behavioral: Negative for decreased concentration and dysphoric mood. The patient is not nervous/anxious.        Mood is improved        Objective:   Physical Exam  Constitutional: She appears well-developed and well-nourished. No distress.  obese and well appearing   HENT:  Head: Normocephalic and atraumatic.  Mouth/Throat: Oropharynx is clear and moist.  Eyes: Conjunctivae and EOM are normal. Pupils are equal, round,  and reactive to light.  Neck: Normal range of motion. Neck supple. No JVD present. Carotid bruit is not present. No thyromegaly present.  Cardiovascular: Normal rate, regular rhythm, normal heart sounds and intact distal pulses. Exam reveals no gallop.  Pulmonary/Chest: Effort normal and breath sounds normal. No respiratory distress. She has no wheezes. She has no rales.  No crackles  Diffusely distant bs  No wheeze   Abdominal: Soft. Bowel sounds are normal. She exhibits no distension, no abdominal bruit and no mass. There is no tenderness.  Musculoskeletal: She exhibits no edema.  Lymphadenopathy:    She  has no cervical adenopathy.  Neurological: She is alert. She has normal reflexes. No cranial nerve deficit. She exhibits normal muscle tone. Coordination normal.  Skin: Skin is warm and dry. No rash noted. No pallor.  No bruising   Psychiatric: She has a normal mood and affect. Her mood appears not anxious. She does not exhibit a depressed mood.          Assessment & Plan:

## 2017-10-08 NOTE — Assessment & Plan Note (Signed)
Will watch this closely  disc imp of low glycemic diet and wt loss to prevent DM2  F/u 6 mo

## 2017-10-08 NOTE — Patient Instructions (Addendum)
We need to keep an eye on your blood sugar  You are at risk for diabetes  Weight loss and better diet and exercise can help  Try to get most of your carbohydrates from produce (with the exception of white potatoes)  Eat less bread/pasta/rice/snack foods/cereals/sweets and other items from the middle of the grocery store (processed carbs)   Think about quitting smoking    We will send for mammogram and colonoscopy report    Tdap shot today  Pneumonia vaccine today as well   F/u 6 mo (blood sugar and platelets-lets re check)

## 2017-10-12 ENCOUNTER — Encounter: Payer: Self-pay | Admitting: Family Medicine

## 2017-10-12 MED ORDER — ZOLPIDEM TARTRATE 10 MG PO TABS: 10.0000 mg | ORAL_TABLET | Freq: Every evening | ORAL | 0 refills | Status: DC | PRN

## 2017-10-12 NOTE — Telephone Encounter (Signed)
Pt called for refill of Remus Lofflerambien  Last refilled in December  Checked the [employer] data base

## 2017-10-17 ENCOUNTER — Ambulatory Visit: Payer: BLUE CROSS/BLUE SHIELD | Admitting: Family Medicine

## 2017-10-23 ENCOUNTER — Other Ambulatory Visit: Payer: Self-pay | Admitting: *Deleted

## 2017-10-23 ENCOUNTER — Inpatient Hospital Stay
Admission: RE | Admit: 2017-10-23 | Discharge: 2017-10-23 | Disposition: A | Discharge status: Home / Self Care | Payer: Self-pay | Source: Ambulatory Visit | Attending: *Deleted | Admitting: *Deleted

## 2017-10-23 DIAGNOSIS — Z9289 Personal history of other medical treatment: Secondary | ICD-10-CM

## 2018-02-28 ENCOUNTER — Other Ambulatory Visit: Payer: Self-pay | Admitting: Family Medicine

## 2018-02-28 MED ORDER — ZOLPIDEM TARTRATE 10 MG PO TABS: 10.0000 mg | ORAL_TABLET | Freq: Every evening | ORAL | 0 refills | Status: DC | PRN

## 2018-02-28 NOTE — Telephone Encounter (Signed)
Name of Medication: Zolpidem [place] [facility] [facility] [place] Date and Quantity:  30 tablet 0 10/12/2017  Last Office Visit and Type: 10/08/17 HTN Next Office Visit and Type: 04/09/18 FU Last Controlled Substance Agreement Date: None Last UDS: None

## 2018-03-31 ENCOUNTER — Encounter: Payer: Self-pay | Admitting: Family Medicine

## 2018-04-02 MED ORDER — ZOLPIDEM TARTRATE 10 MG PO TABS: 10.0000 mg | ORAL_TABLET | Freq: Every evening | ORAL | 0 refills | Status: DC | PRN

## 2018-04-02 NOTE — Telephone Encounter (Signed)
Last filled on 02/28/18 #30 tabs with 0 refills, 6 month f/u scheduled for 04/09/18

## 2018-04-03 ENCOUNTER — Other Ambulatory Visit: Payer: Self-pay

## 2018-04-09 ENCOUNTER — Ambulatory Visit: Payer: BLUE CROSS/BLUE SHIELD | Admitting: Family Medicine

## 2018-06-02 ENCOUNTER — Other Ambulatory Visit: Payer: Self-pay | Admitting: Family Medicine

## 2018-06-02 ENCOUNTER — Encounter: Payer: Self-pay | Admitting: Family Medicine

## 2018-06-03 MED ORDER — ZOLPIDEM TARTRATE 10 MG PO TABS: 10.0000 mg | ORAL_TABLET | Freq: Every evening | ORAL | 1 refills | Status: DC | PRN

## 2018-06-03 NOTE — Telephone Encounter (Signed)
Name of Medication: Ambien Name of Pharmacy: [facility] [address] Last [place] or Written Date and Quantity: 04/02/18 #30 tabs with 0 refills Last Office Visit and Type: f/u on 10/08/17 Next Office Visit and Type:  6 month f/u on 08/23/18 Last Controlled Substance Agreement Date: no UDS Last UDS: no UDS

## 2018-06-09 MED ORDER — LOSARTAN POTASSIUM 100 MG PO TABS: 100.0000 mg | ORAL_TABLET | Freq: Every day | ORAL | 1 refills | Status: DC

## 2018-06-09 MED ORDER — HYDROCHLOROTHIAZIDE 25 MG PO TABS: 25.0000 mg | ORAL_TABLET | Freq: Every day | ORAL | 1 refills | Status: DC

## 2018-08-20 ENCOUNTER — Other Ambulatory Visit (INDEPENDENT_AMBULATORY_CARE_PROVIDER_SITE_OTHER): Payer: PRIVATE HEALTH INSURANCE

## 2018-08-20 DIAGNOSIS — R7989 Other specified abnormal findings of blood chemistry: Secondary | ICD-10-CM

## 2018-08-20 DIAGNOSIS — I10 Essential (primary) hypertension: Secondary | ICD-10-CM

## 2018-08-20 DIAGNOSIS — R7309 Other abnormal glucose: Secondary | ICD-10-CM | POA: Diagnosis not present

## 2018-08-20 LAB — COMPREHENSIVE METABOLIC PANEL
ALT: 13 U/L (ref 0–35)
AST: 11 U/L (ref 0–37)
Albumin: 3.9 g/dL (ref 3.5–5.2)
Alkaline Phosphatase: 106 U/L (ref 39–117)
BILIRUBIN TOTAL: 0.3 mg/dL (ref 0.2–1.2)
BUN: 18 mg/dL (ref 6–23)
CALCIUM: 9.3 mg/dL (ref 8.4–10.5)
CO2: 32 meq/L (ref 19–32)
CREATININE: 0.72 mg/dL (ref 0.40–1.20)
Chloride: 101 mEq/L (ref 96–112)
GFR: 87.14 mL/min (ref >60.00)
GLUCOSE: 130 mg/dL — AB (ref 70–99)
Potassium: 4 mEq/L (ref 3.5–5.1)
SODIUM: 138 meq/L (ref 135–145)
Total Protein: 6.7 g/dL (ref 6.0–8.3)

## 2018-08-20 LAB — CBC WITH DIFFERENTIAL/PLATELET
BASOS ABS: 0.1 10*3/uL (ref 0.0–0.1)
Basophils Relative: 0.5 % (ref 0.0–3.0)
EOS ABS: 0.2 10*3/uL (ref 0.0–0.7)
Eosinophils Relative: 2 % (ref 0.0–5.0)
HCT: 44.8 % (ref 36.0–46.0)
Hemoglobin: 15.1 g/dL — ABNORMAL HIGH (ref 12.0–15.0)
LYMPHS ABS: 2.4 10*3/uL (ref 0.7–4.0)
Lymphocytes Relative: 22.3 % (ref 12.0–46.0)
MCHC: 33.8 g/dL (ref 30.0–36.0)
MCV: 92.6 fl (ref 78.0–100.0)
Monocytes Absolute: 0.9 10*3/uL (ref 0.1–1.0)
Monocytes Relative: 8.1 % (ref 3.0–12.0)
NEUTROS ABS: 7.1 10*3/uL (ref 1.4–7.7)
NEUTROS PCT: 67.1 % (ref 43.0–77.0)
PLATELETS: 356 10*3/uL (ref 150.0–400.0)
RBC: 4.84 Mil/uL (ref 3.87–5.11)
RDW: 12.5 % (ref 11.5–15.5)
WBC: 10.6 10*3/uL — ABNORMAL HIGH (ref 4.0–10.5)

## 2018-08-20 LAB — HEMOGLOBIN A1C: Hgb A1c MFr Bld: 7 % — ABNORMAL HIGH (ref 4.6–6.5)

## 2018-08-23 ENCOUNTER — Ambulatory Visit (INDEPENDENT_AMBULATORY_CARE_PROVIDER_SITE_OTHER): Payer: PRIVATE HEALTH INSURANCE | Admitting: Family Medicine

## 2018-08-23 ENCOUNTER — Encounter: Payer: Self-pay | Admitting: Family Medicine

## 2018-08-23 ENCOUNTER — Other Ambulatory Visit: Payer: Self-pay | Admitting: *Deleted

## 2018-08-23 VITALS — BP 116/70 | HR 71 | Temp 97.9°F | Ht 66.0 in | Wt 225.0 lb

## 2018-08-23 DIAGNOSIS — Z23 Encounter for immunization: Secondary | ICD-10-CM | POA: Diagnosis not present

## 2018-08-23 DIAGNOSIS — F418 Other specified anxiety disorders: Secondary | ICD-10-CM

## 2018-08-23 DIAGNOSIS — I10 Essential (primary) hypertension: Secondary | ICD-10-CM

## 2018-08-23 DIAGNOSIS — E119 Type 2 diabetes mellitus without complications: Secondary | ICD-10-CM | POA: Diagnosis not present

## 2018-08-23 DIAGNOSIS — E7800 Pure hypercholesterolemia, unspecified: Secondary | ICD-10-CM

## 2018-08-23 DIAGNOSIS — E669 Obesity, unspecified: Secondary | ICD-10-CM

## 2018-08-23 DIAGNOSIS — F17200 Nicotine dependence, unspecified, uncomplicated: Secondary | ICD-10-CM

## 2018-08-23 DIAGNOSIS — F5101 Primary insomnia: Secondary | ICD-10-CM

## 2018-08-23 MED ORDER — PAROXETINE HCL 40 MG PO TABS: 80.0000 mg | ORAL_TABLET | Freq: Every day | ORAL | 1 refills | Status: DC

## 2018-08-23 MED ORDER — LOSARTAN POTASSIUM 100 MG PO TABS: 100.0000 mg | ORAL_TABLET | Freq: Every day | ORAL | 3 refills | Status: DC

## 2018-08-23 MED ORDER — HYDROCHLOROTHIAZIDE 25 MG PO TABS: 25.0000 mg | ORAL_TABLET | Freq: Every day | ORAL | 3 refills | Status: DC

## 2018-08-23 MED ORDER — ATORVASTATIN CALCIUM 20 MG PO TABS: 20.0000 mg | ORAL_TABLET | Freq: Every day | ORAL | 3 refills | Status: DC

## 2018-08-23 NOTE — Patient Instructions (Addendum)
For diabetes and weight loss Try to get most of your carbohydrates from produce (with the exception of white potatoes)  Eat less bread/pasta/rice/snack foods/cereals/sweets and other items from the middle of the grocery store (processed carbs)   Our office will call you regarding an eye doctor appt. And also refer to diabetic teaching   Start regular exercise  Gradually get off sweet tea  Drink more water   Keep thinking about about quitting smoking

## 2018-08-23 NOTE — Progress Notes (Signed)
Subjective:    Patient ID: Kathleen Hernandez, female    DOB: Jan 15, 1956, 63 y.o.   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  Holidays are busy   Taking fair care of herself    Wt Readings from Last 3 Encounters:  08/23/18 225 lb (102.1 kg)  10/08/17 213 lb (96.6 kg)  09/26/17 211 lb 8 oz (95.9 kg)  she was aware she gained  She is inside less  Eats healthy half of the time  Does drink sweet tea - 2 glasses a day/big  36.32 kg/m   Flu shot given today   Smoking status -about a pack daily  No interest in quitting  No cough or wheezing    bp is stable today  No cp or palpitations or headaches or edema  No side effects to medicines  BP Readings from Last 3 Encounters:  08/23/18 116/70  10/08/17 126/68  09/26/17 100/70     Takes losartan and hctz  Lab Results  Component Value Date   CREATININE 0.72 08/20/2018   BUN 18 08/20/2018   NA 138 08/20/2018   K 4.0 08/20/2018   CL 101 08/20/2018   CO2 32 08/20/2018   Lab Results  Component Value Date   ALT 13 08/20/2018   AST 11 08/20/2018   ALKPHOS 106 08/20/2018   BILITOT 0.3 08/20/2018    Lab Results  Component Value Date   WBC 10.6 (H) 08/20/2018   HGB 15.1 (H) 08/20/2018   HCT 44.8 08/20/2018   MCV 92.6 08/20/2018   PLT 356.0 08/20/2018   she used to donate blood all the time   On paxil for mood /dep and anx Mood is good   Elevated glucose level in the past Lab Results  Component Value Date   HGBA1C 7.0 (H) 08/20/2018  130 fasting glucose this check  Family hx- gmother  She thinks she would be able to find time for diet and exercise  No increased thirst or urination  No tingling or neuropathy symptoms   On statin  She is on arb as well      Hyperlipidemia Lab Results  Component Value Date   CHOL 145 10/01/2017   HDL 44.90 10/01/2017   LDLCALC 79 10/01/2017   TRIG 106.0 10/01/2017    CHOLHDL 3 10/01/2017   Taking atorvastatin   ambien for chronic insomnia  She takes it nightly    Patient Active Problem List   Diagnosis Date Noted  . Type 2 diabetes mellitus without complications (HCC) 08/23/2018  . Elevated glucose level 10/08/2017  . Elevated platelet count 10/08/2017  . Hyperlipidemia 08/31/2017  . Smoker 08/31/2017  . Hypertension 08/31/2017  . Encounter for screening mammogram for breast cancer 08/31/2017  . Obesity (BMI 30-39.9) 08/31/2017  . Depression with anxiety 08/31/2017  . Insomnia 08/31/2017   Past Medical History:  Diagnosis Date  . Family history of heart murmur   . History of chickenpox   . History of colon polyps   . History of depression   . History of gastroesophageal reflux (GERD)   . History of hyperlipidemia   . History of urinary incontinence    Past Surgical History:  Procedure Laterality Date  . BREAST BIOPSY Right 2000   neg  . BREAST REDUCTION SURGERY  2010  . GALLBLADDER SURGERY  2009  . INCONTINENCE SURGERY  2009  . PARTIAL HYSTERECTOMY  2009  . REDUCTION MAMMAPLASTY Bilateral 2000   Social History   Tobacco Use  . Smoking status: Current Every Day Smoker    Packs/day: 1.00  . Smokeless tobacco: Never Used  Substance Use Topics  . Alcohol use: No    Frequency: Never  . Drug use: No   Family History  Problem Relation Age of Onset  . Arthritis Mother   . Heart attack Mother   . Early death Mother   . Alcohol abuse Father   . Alzheimer's disease Father   . Non-Hodgkin's lymphoma Sister   . Depression Sister   . Alcohol abuse Brother   . COPD Brother   . Heart attack Brother   . Hyperlipidemia Brother   . Diabetes Maternal Grandmother   . Alcohol abuse Maternal Grandfather   . Alzheimer's disease Paternal Grandmother   . Drug abuse Son    Allergies  Allergen Reactions  . Requip [Ropinirole Hcl] Other (See Comments)    Tongue swelling  . Chantix [Varenicline] Rash   Current Outpatient  Medications on File Prior to Visit  Medication Sig Dispense Refill  . aspirin 325 MG tablet Take 325 mg by mouth daily.    . Cholecalciferol (VITAMIN D3) 2000 units TABS Take 1 tablet by mouth daily.    . diphenhydrAMINE (BENADRYL) 25 MG tablet Take 25 mg by mouth daily as needed.    . Omega-3 Fatty Acids (FISH OIL) 1200 MG CAPS Take 1 capsule by mouth daily.    . vitamin E 400 UNIT capsule Take 400 Units by mouth daily.    Marland Kitchen zolpidem (AMBIEN) 10 MG tablet Take 1 tablet (10 mg total) by mouth at bedtime as needed for sleep. 30 tablet 1   No current facility-administered medications on file prior to visit.     Review of Systems  Constitutional: Negative for activity change, appetite change, fatigue, fever and unexpected weight change.  HENT: Negative for congestion, ear pain, rhinorrhea, sinus pressure and sore throat.   Eyes: Negative for pain, redness and visual disturbance.  Respiratory: Negative for cough, shortness of breath and wheezing.   Cardiovascular: Negative for chest pain and palpitations.  Gastrointestinal: Negative for abdominal pain, blood in stool, constipation and diarrhea.  Endocrine: Negative for polydipsia and polyuria.  Genitourinary: Negative for dysuria, frequency and urgency.  Musculoskeletal: Negative for arthralgias, back pain and myalgias.  Skin: Negative for pallor and rash.  Allergic/Immunologic: Negative for environmental allergies.  Neurological: Negative for dizziness, syncope and headaches.  Hematological: Negative for adenopathy. Does not bruise/bleed easily.  Psychiatric/Behavioral: Negative for decreased concentration and dysphoric mood. The patient is not nervous/anxious.        Objective:   Physical Exam Constitutional:      General: She is not in acute distress.    Appearance: Normal appearance. She is well-developed. She is obese. She is not ill-appearing or diaphoretic.  HENT:     Head: Normocephalic and atraumatic.     Mouth/Throat:      Mouth: Mucous membranes are moist.     Pharynx: Oropharynx is clear.  Eyes:     General: No scleral icterus.    Conjunctiva/sclera: Conjunctivae normal.     Pupils: Pupils are equal, round, and reactive to light.  Neck:     Musculoskeletal: Normal range of motion and neck supple.     Thyroid: No thyromegaly.     Vascular: No carotid bruit or JVD.  Cardiovascular:     Rate and Rhythm: Normal rate and regular rhythm.  Pulses: Normal pulses.     Heart sounds: Normal heart sounds. No murmur. No gallop.   Pulmonary:     Effort: Pulmonary effort is normal. No respiratory distress.     Breath sounds: Normal breath sounds. No wheezing or rales.  Abdominal:     General: Bowel sounds are normal. There is no distension or abdominal bruit.     Palpations: Abdomen is soft. There is no mass.     Tenderness: There is no abdominal tenderness.  Musculoskeletal:        General: No deformity.     Right lower leg: No edema.     Left lower leg: No edema.  Lymphadenopathy:     Cervical: No cervical adenopathy.  Skin:    General: Skin is warm and dry.     Capillary Refill: Capillary refill takes less than 2 seconds.     Findings: No lesion or rash.  Neurological:     General: No focal deficit present.     Mental Status: She is alert. Mental status is at baseline.     Coordination: Coordination normal.     Deep Tendon Reflexes: Reflexes are normal and symmetric. Reflexes normal.  Psychiatric:        Mood and Affect: Mood normal.           Assessment & Plan:   Problem List Items Addressed This Visit      Cardiovascular and Mediastinum   Hypertension    . bp in fair control at this time  BP Readings from Last 1 Encounters:  08/23/18 116/70   No changes needed Most recent labs reviewed  Disc lifstyle change with low sodium diet and exercise  Smoking cessation encouraged       Relevant Medications   atorvastatin (LIPITOR) 20 MG tablet   hydrochlorothiazide (HYDRODIURIL) 25 MG  tablet   losartan (COZAAR) 100 MG tablet     Endocrine   Type 2 diabetes mellitus without complications (HCC) - Primary    Lab Results  Component Value Date   HGBA1C 7.0 (H) 08/20/2018   This is a new diagnosis  Disc imp of glucose control and long term risk of end organ damage from DM Disc need for wt loss along with fitness and low glycemic eating  Nl foot exam-foot care disc Ref to opth for dm eye exam  Ref for DM teaching also  F/u 3 mo       Relevant Medications   atorvastatin (LIPITOR) 20 MG tablet   losartan (COZAAR) 100 MG tablet   Other Relevant Orders   Ambulatory referral to Ophthalmology   Ambulatory referral to diabetic education     Other   Smoker    Disc in detail risks of smoking and possible outcomes including copd, vascular/ heart disease, cancer , respiratory and sinus infections  Pt voices understanding Risks are up with new DM Pt is not motivated to quit       Obesity (BMI 30-39.9)    Discussed how this problem influences overall health and the risks it imposes  Reviewed plan for weight loss with lower calorie diet (via better food choices and also portion control or program like weight watchers) and exercise building up to or more than 30 minutes 5 days per week including some aerobic activity         Insomnia    ambien nightly  Disc risl of habit/sedation and falls        Hyperlipidemia    Disc goals for lipids  and reasons to control them Rev last labs with pt Rev low sat fat diet in detail Controled on atorvastatin  LDL is 79 Goal for DM is under 70 Working on diet before dose change      Relevant Medications   atorvastatin (LIPITOR) 20 MG tablet   hydrochlorothiazide (HYDRODIURIL) 25 MG tablet   losartan (COZAAR) 100 MG tablet   Depression with anxiety    Doing well with paxil - wishes to continue it  Reviewed stressors/ coping techniques/symptoms/ support sources/ tx options and side effects in detail today        Other Visit  Diagnoses    Need for influenza vaccination       Relevant Orders   Flu Vaccine QUAD 6+ mos PF IM (Fluarix Quad PF) (Completed)

## 2018-08-24 NOTE — Assessment & Plan Note (Signed)
Lab Results  Component Value Date   HGBA1C 7.0 (H) 08/20/2018   This is a new diagnosis  Disc imp of glucose control and long term risk of end organ damage from DM Disc need for wt loss along with fitness and low glycemic eating  Nl foot exam-foot care disc Ref to opth for dm eye exam  Ref for DM teaching also  F/u 3 mo

## 2018-08-24 NOTE — Assessment & Plan Note (Signed)
ambien nightly  Disc risl of habit/sedation and falls

## 2018-08-24 NOTE — Assessment & Plan Note (Signed)
Doing well with paxil - wishes to continue it  Reviewed stressors/ coping techniques/symptoms/ support sources/ tx options and side effects in detail today

## 2018-08-24 NOTE — Assessment & Plan Note (Signed)
Discussed how this problem influences overall health and the risks it imposes  Reviewed plan for weight loss with lower calorie diet (via better food choices and also portion control or program like weight watchers) and exercise building up to or more than 30 minutes 5 days per week including some aerobic activity    

## 2018-08-24 NOTE — Assessment & Plan Note (Signed)
Disc goals for lipids and reasons to control them Rev last labs with pt Rev low sat fat diet in detail Controled on atorvastatin  LDL is 79 Goal for DM is under 70 Working on diet before dose change

## 2018-08-24 NOTE — Assessment & Plan Note (Signed)
Disc in detail risks of smoking and possible outcomes including copd, vascular/ heart disease, cancer , respiratory and sinus infections  Pt voices understanding Risks are up with new DM Pt is not motivated to quit

## 2018-08-24 NOTE — Assessment & Plan Note (Signed)
.   bp in fair control at this time  BP Readings from Last 1 Encounters:  08/23/18 116/70   No changes needed Most recent labs reviewed  Disc lifstyle change with low sodium diet and exercise  Smoking cessation encouraged

## 2018-08-28 ENCOUNTER — Other Ambulatory Visit: Payer: Self-pay | Admitting: *Deleted

## 2018-08-28 NOTE — Telephone Encounter (Signed)
She takes 80 mg daily  I don't think it comes in an 80 mg pill, does it?

## 2018-08-28 NOTE — Telephone Encounter (Signed)
Received fax saying that paxil is only covered at one tab per day not 2 tabs per insurance, pharmacy asked if you want to change Rx to once daily dosing or change Rx completely, please advise   [facility] [address]

## 2018-08-29 NOTE — Telephone Encounter (Signed)
40 mg was the highest dose I found when checking under the med&orders

## 2018-08-29 NOTE — Telephone Encounter (Signed)
Please let her know and the pharmacy- ? If we may have to change it?

## 2018-08-30 NOTE — Telephone Encounter (Signed)
Pt called back and said we can try and do a quantity override through her insurance. Called the # pt provided and did a Prior Auth for the the dosing of 2 tabs daily Conf # [id number] of the Prior Auth. Rep said we will get a response withing 72 business hrs.

## 2018-08-30 NOTE — Telephone Encounter (Signed)
The pharmacy is the office that faxed the letter so they are aware. Called pt and she said she's been on 80mg  for years and she is afraid to cut dose down to one tab and she is also afraid to change med because she's been so stable on med for years, pt said she will call her insurance and see if there is anything she can do to get them to pay for the 2 tabs daily dosing and will call us back  FYI to Dr. Milinda Antis

## 2018-09-02 NOTE — Telephone Encounter (Signed)
Received fax saying that her insurance denied the 2 tablet dosing Prior Auth. It only covers 40mg  once daily and that's it. Letter placed in your inbox for review

## 2018-09-02 NOTE — Telephone Encounter (Signed)
It looks like she will have to either pay our of pocket or we change dose (or medicine) entirely  Let me know what she wants to do  Thanks

## 2018-09-03 NOTE — Telephone Encounter (Signed)
Called pt and no answer and no VM just said caller not available and to try later

## 2018-09-05 MED ORDER — PAROXETINE HCL 40 MG PO TABS: 40.0000 mg | ORAL_TABLET | Freq: Every day | ORAL | 3 refills | Status: DC

## 2018-09-05 NOTE — Telephone Encounter (Signed)
Pt notified of prior Auth being denied pt said she will just try the one tablet daily, [facility] [address] [facility] the one tablet dosing so when she is done with the bottle she has she will pick up the one tab Rx, ? If she needs to taper med or is she okay going straight to one tablet daily once she is done with bottle she has

## 2018-09-05 NOTE — Telephone Encounter (Signed)
Please have her go down to 1 1/2 pills for 1-2 weeks before decreasing to 40 mg daily  Let me know if any problems or if we need to change medication

## 2018-09-06 NOTE — Telephone Encounter (Signed)
Pt notified of Dr. Tower's instructions and verbalized understanding  

## 2018-09-11 ENCOUNTER — Encounter: Payer: Self-pay | Admitting: Family Medicine

## 2018-09-11 ENCOUNTER — Encounter: Payer: PRIVATE HEALTH INSURANCE | Attending: Family Medicine | Admitting: *Deleted

## 2018-09-11 ENCOUNTER — Encounter: Payer: Self-pay | Admitting: *Deleted

## 2018-09-11 VITALS — BP 112/70 | Ht 67.0 in | Wt 222.4 lb

## 2018-09-11 DIAGNOSIS — E119 Type 2 diabetes mellitus without complications: Secondary | ICD-10-CM | POA: Insufficient documentation

## 2018-09-11 NOTE — Patient Instructions (Addendum)
Check blood sugars 2 x day before breakfast and 2 hrs after one meal 3 x week Bring blood sugar records to the next class  Call your doctor for a prescription for:  1. Meter strips (type) Accu-Chek Guide checking  3 times per week  2. Lancets (type) Accu-Chek FastClix checking  3     times per week  Exercise: Begin walking  for  10 minutes  3 days a week and gradually increase to 150 minutes/week  Eat 3 meals day,  1-2 snacks a day Space meals 4-6 hours apart Don't skip meals Avoid sugar sweetened drinks (coffee)  Quit smoking  Return for classes on:

## 2018-09-11 NOTE — Progress Notes (Signed)
Diabetes Self-Management Education  Visit Type: First/Initial  Appt. Start Time: 0900 Appt. End Time: 1040  09/11/2018  [name], identified by name and date of birth, is a 63 y.o. female with a diagnosis of Diabetes: Type 2.   ASSESSMENT  Blood pressure 112/70, height 5\' 7"  (1.702 m), weight 222 lb 6.4 oz (100.9 kg). Body mass index is 34.83 kg/m.  Diabetes Self-Management Education - 09/11/18 1117      Visit Information   Visit Type  First/Initial      Initial Visit   Diabetes Type  Type 2    Are you currently following a meal plan?  Yes    What type of meal plan do you follow?  "cut out sugar, sweet tea and carbs, eating proteins, fruits and vegs"    Are you taking your medications as prescribed?  Yes    Date Diagnosed  this month      Health Coping   How would you rate your overall health?  Good      Psychosocial Assessment   Patient Belief/Attitude about Diabetes  Other (comment)   "worried" - patient is primary caregiver for 2 grandsons   Self-care barriers  None    Self-management support  Doctor's office    Patient Concerns  Nutrition/Meal planning;Glycemic Control;Weight Control;Monitoring;Healthy Lifestyle    Special Needs  None    Preferred Learning Style  Auditory    Learning Readiness  Change in progress    How often do you need to have someone help you when you read instructions, pamphlets, or other written materials from your doctor or pharmacy?  1 - Never    What is the last grade level you completed in school?  12th      Pre-Education Assessment   Patient understands the diabetes disease and treatment process.  Needs Instruction    Patient understands incorporating nutritional management into lifestyle.  Needs Instruction    Patient undertands incorporating physical activity into lifestyle.  Needs Instruction    Patient understands using medications safely.  Needs Instruction    Patient understands monitoring blood glucose, interpreting and  using results  Needs Instruction    Patient understands prevention, detection, and treatment of acute complications.  Needs Instruction    Patient understands prevention, detection, and treatment of chronic complications.  Needs Instruction    Patient understands how to develop strategies to address psychosocial issues.  Needs Instruction    Patient understands how to develop strategies to promote health/change behavior.  Needs Instruction      Complications   Last HgB A1C per patient/outside source  7 %   08/20/18   How often do you check your blood sugar?  0 times/day (not testing)   Provided Accu-Chek Guide Me meter and instructed on use. BG upon return demonstration was 112 mg/dL at 50:09 am - 5 hrs pp.    Have you had a dilated eye exam in the past 12 months?  No   apt 2/19   Have you had a dental exam in the past 12 months?  No    Are you checking your feet?  Yes    How many days per week are you checking your feet?  7      Dietary Intake   Breakfast  cereal and milk, bagel and cream cheese, cottage cheese and fruit    Lunch  skips    Dinner  chicken, beef, pork, fish with peas, beans, corn, green beans, salad - lttuce, tomatoes, cuccumbers,  radishes; broccoli, greens, cabbage, cauliflower    Snack (evening)  peanuts    Beverage(s)  water, coffee with sugar, diet soda      Exercise   Exercise Type  ADL's      Patient Education   Disease state   Definition of diabetes, type 1 and 2, and the diagnosis of diabetes;Factors that contribute to the development of diabetes    Nutrition management   Role of diet in the treatment of diabetes and the relationship between the three main macronutrients and blood glucose level;Food label reading, portion sizes and measuring food.;Reviewed blood glucose goals for pre and post meals and how to evaluate the patients' food intake on their blood glucose level.    Physical activity and exercise   Role of exercise on diabetes management, blood pressure  control and cardiac health.    Monitoring  Taught/evaluated SMBG meter.;Purpose and frequency of SMBG.;Taught/discussed recording of test results and interpretation of SMBG.;Identified appropriate SMBG and/or A1C goals.    Chronic complications  Relationship between chronic complications and blood glucose control    Psychosocial adjustment  Identified and addressed patients feelings and concerns about diabetes    Personal strategies to promote health  Review risk of smoking and offered smoking cessation      Individualized Goals (developed by patient)   Reducing Risk  Improve blood sugars Prevent diabetes complications Lose weight Lead a healthier lifestyle     Outcomes   Expected Outcomes  Demonstrated interest in learning. Expect positive outcomes    Future DMSE  2 wks       Individualized Plan for Diabetes Self-Management Training:   Learning Objective:  Patient will have a greater understanding of diabetes self-management. Patient education plan is to attend individual and/or group sessions per assessed needs and concerns.   Plan:   Patient Instructions  Check blood sugars 2 x day before breakfast and 2 hrs after one meal 3 x week Bring blood sugar records to the next class Call your doctor for a prescription for:  1. Meter strips (type) Accu-Chek Guide checking  3 times per week  2. Lancets (type) Accu-Chek FastClix checking  3     times per week Exercise: Begin walking  for  10 minutes  3 days a week and gradually increase to 150 minutes/week Eat 3 meals day,  1-2 snacks a day Space meals 4-6 hours apart Don't skip meals Avoid sugar sweetened drinks (coffee) Quit smoking  Expected Outcomes:  Demonstrated interest in learning. Expect positive outcomes  Education material provided:  General Meal Planning Guidelines Simple Meal Plan Meter = Accu-Chek Guide Me  If problems or questions, patient to contact team via:  Sharion Settler, RN, CCM, CDE [phone]  Future  DSME appointment: 2 wks  September 26, 2018 for Diabetes Class 1

## 2018-09-13 MED ORDER — GLUCOSE BLOOD VI STRP: ORAL_STRIP | 3 refills | Status: DC

## 2018-09-13 MED ORDER — ACCU-CHEK FASTCLIX LANCETS MISC: 3 refills | Status: AC

## 2018-09-26 ENCOUNTER — Encounter: Payer: PRIVATE HEALTH INSURANCE | Attending: Family Medicine | Admitting: Dietician

## 2018-09-26 ENCOUNTER — Encounter: Payer: Self-pay | Admitting: Dietician

## 2018-09-26 VITALS — Ht 67.0 in | Wt 221.4 lb

## 2018-09-26 DIAGNOSIS — E119 Type 2 diabetes mellitus without complications: Secondary | ICD-10-CM | POA: Diagnosis present

## 2018-09-26 NOTE — Progress Notes (Signed)

## 2018-10-02 LAB — HM DIABETES EYE EXAM

## 2018-10-03 ENCOUNTER — Ambulatory Visit: Payer: PRIVATE HEALTH INSURANCE

## 2018-10-03 ENCOUNTER — Encounter: Payer: Self-pay | Admitting: Dietician

## 2018-10-03 ENCOUNTER — Encounter: Payer: Self-pay | Admitting: Family Medicine

## 2018-10-03 NOTE — Progress Notes (Signed)
Pt did not come to class today; called pt but no answer-unable to leave a message

## 2018-10-04 ENCOUNTER — Other Ambulatory Visit: Payer: Self-pay | Admitting: Family Medicine

## 2018-10-04 NOTE — Telephone Encounter (Signed)
Name of Medication: Ambien Name of Pharmacy: [facility] [address] Last [place] or Written Date and Quantity: 06/03/18 #30 tabs with 1 refills Last Office Visit and Type: 6 month f/u on 08/23/18 Next Office Visit and Type:  f/u on 11/25/18 Last Controlled Substance Agreement Date: no UDS Last UDS: no UDS

## 2018-10-10 ENCOUNTER — Ambulatory Visit: Payer: PRIVATE HEALTH INSURANCE

## 2018-10-10 ENCOUNTER — Encounter: Payer: Self-pay | Admitting: Dietician

## 2018-10-10 NOTE — Progress Notes (Signed)
Patient rescheduled classes 1 and 2 to 10/31/18 and 11/07/18.

## 2018-10-31 ENCOUNTER — Telehealth: Payer: Self-pay | Admitting: *Deleted

## 2018-10-31 ENCOUNTER — Ambulatory Visit: Payer: PRIVATE HEALTH INSURANCE

## 2018-10-31 NOTE — Telephone Encounter (Signed)
Patient left voice mail yesterday regarding classes. She wants to reschedule due to corona virus. Called her and she reports that she would rather come in April. Rescheduled classes 2 and 3 for April 16 and April 23.

## 2018-11-07 ENCOUNTER — Ambulatory Visit: Payer: PRIVATE HEALTH INSURANCE

## 2018-11-20 ENCOUNTER — Other Ambulatory Visit: Payer: Self-pay

## 2018-11-20 ENCOUNTER — Other Ambulatory Visit (INDEPENDENT_AMBULATORY_CARE_PROVIDER_SITE_OTHER): Payer: PRIVATE HEALTH INSURANCE

## 2018-11-20 ENCOUNTER — Other Ambulatory Visit: Payer: PRIVATE HEALTH INSURANCE

## 2018-11-20 DIAGNOSIS — I10 Essential (primary) hypertension: Secondary | ICD-10-CM | POA: Diagnosis not present

## 2018-11-20 DIAGNOSIS — E119 Type 2 diabetes mellitus without complications: Secondary | ICD-10-CM

## 2018-11-20 DIAGNOSIS — E7800 Pure hypercholesterolemia, unspecified: Secondary | ICD-10-CM

## 2018-11-20 LAB — COMPREHENSIVE METABOLIC PANEL
ALT: 13 U/L (ref 0–35)
AST: 13 U/L (ref 0–37)
Albumin: 3.7 g/dL (ref 3.5–5.2)
Alkaline Phosphatase: 96 U/L (ref 39–117)
BUN: 18 mg/dL (ref 6–23)
CO2: 30 mEq/L (ref 19–32)
Calcium: 9 mg/dL (ref 8.4–10.5)
Chloride: 105 mEq/L (ref 96–112)
Creatinine, Ser: 0.71 mg/dL (ref 0.40–1.20)
GFR: 83.25 mL/min (ref >60.00)
Glucose, Bld: 118 mg/dL — ABNORMAL HIGH (ref 70–99)
Potassium: 4.1 mEq/L (ref 3.5–5.1)
Sodium: 141 mEq/L (ref 135–145)
Total Bilirubin: 0.3 mg/dL (ref 0.2–1.2)
Total Protein: 6.3 g/dL (ref 6.0–8.3)

## 2018-11-20 LAB — CBC WITH DIFFERENTIAL/PLATELET
Basophils Absolute: 0 10*3/uL (ref 0.0–0.1)
Basophils Relative: 0.5 % (ref 0.0–3.0)
Eosinophils Absolute: 0.2 10*3/uL (ref 0.0–0.7)
Eosinophils Relative: 2.6 % (ref 0.0–5.0)
HCT: 43.1 % (ref 36.0–46.0)
Hemoglobin: 14.4 g/dL (ref 12.0–15.0)
Lymphocytes Relative: 21.5 % (ref 12.0–46.0)
Lymphs Abs: 2 10*3/uL (ref 0.7–4.0)
MCHC: 33.4 g/dL (ref 30.0–36.0)
MCV: 93.6 fl (ref 78.0–100.0)
Monocytes Absolute: 0.8 10*3/uL (ref 0.1–1.0)
Monocytes Relative: 8.8 % (ref 3.0–12.0)
Neutro Abs: 6.3 10*3/uL (ref 1.4–7.7)
Neutrophils Relative %: 66.6 % (ref 43.0–77.0)
Platelets: 344 10*3/uL (ref 150.0–400.0)
RBC: 4.6 Mil/uL (ref 3.87–5.11)
RDW: 13 % (ref 11.5–15.5)
WBC: 9.4 10*3/uL (ref 4.0–10.5)

## 2018-11-20 LAB — LIPID PANEL
Cholesterol: 128 mg/dL (ref 0–200)
HDL: 33.2 mg/dL — ABNORMAL LOW (ref >39.00)
LDL Cholesterol: 75 mg/dL (ref 0–99)
NonHDL: 94.92
Total CHOL/HDL Ratio: 4
Triglycerides: 101 mg/dL (ref 0.0–149.0)
VLDL: 20.2 mg/dL (ref 0.0–40.0)

## 2018-11-20 LAB — HEMOGLOBIN A1C: Hgb A1c MFr Bld: 6.5 % (ref 4.6–6.5)

## 2018-11-22 ENCOUNTER — Ambulatory Visit: Payer: PRIVATE HEALTH INSURANCE | Admitting: Family Medicine

## 2018-11-25 ENCOUNTER — Encounter: Payer: Self-pay | Admitting: Family Medicine

## 2018-11-25 ENCOUNTER — Ambulatory Visit (INDEPENDENT_AMBULATORY_CARE_PROVIDER_SITE_OTHER): Payer: PRIVATE HEALTH INSURANCE | Admitting: Family Medicine

## 2018-11-25 DIAGNOSIS — E669 Obesity, unspecified: Secondary | ICD-10-CM

## 2018-11-25 DIAGNOSIS — E119 Type 2 diabetes mellitus without complications: Secondary | ICD-10-CM

## 2018-11-25 DIAGNOSIS — F418 Other specified anxiety disorders: Secondary | ICD-10-CM

## 2018-11-25 DIAGNOSIS — E7800 Pure hypercholesterolemia, unspecified: Secondary | ICD-10-CM | POA: Diagnosis not present

## 2018-11-25 DIAGNOSIS — F17200 Nicotine dependence, unspecified, uncomplicated: Secondary | ICD-10-CM

## 2018-11-25 DIAGNOSIS — F5101 Primary insomnia: Secondary | ICD-10-CM | POA: Diagnosis not present

## 2018-11-25 DIAGNOSIS — I10 Essential (primary) hypertension: Secondary | ICD-10-CM | POA: Diagnosis not present

## 2018-11-25 NOTE — Assessment & Plan Note (Signed)
Disc in detail risks of smoking and possible outcomes including copd, vascular/ heart disease, cancer , respiratory and sinus infections  Pt voices understanding She is not interested in quitting at this time

## 2018-11-25 NOTE — Assessment & Plan Note (Signed)
Disc goals for lipids and reasons to control them Rev last labs with pt Rev low sat fat diet in detail Fairly stable  LDL in 80s  Expect HDL to improve with more exercise Will continue atorvastatin

## 2018-11-25 NOTE — Progress Notes (Signed)
Virtual Visit via Video Note  I connected with Kathleen Hernandez on 11/25/18 at 11:30 AM EDT by a video enabled telemedicine application and verified that I am speaking with the correct person using two identifiers. Patient was at home today I am in my office   Virtual visit with camera was attempted but pt could not contact by doxy me so entire visit was accomplished by phone instead    I discussed the limitations of evaluation and management by telemedicine and the availability of in person appointments. The patient expressed understanding and agreed to proceed.  History of Present Illness: For f/u of chronic health problems   Feeling good  Working outside a lot - good  Cleaned garage    Weight - does not weigh herself / doubts it has changed much Last bmi was 36.3 Has quit sweet tea completely   Smoking status  No changes in that    bp is stable today  No cp or palpitations or headaches or edema  No side effects to medicines  BP Readings from Last 3 Encounters:  09/11/18 112/70  08/23/18 116/70  10/08/17 126/68    Does not check at home   Lab Results  Component Value Date   CREATININE 0.71 11/20/2018   BUN 18 11/20/2018   NA 141 11/20/2018   K 4.1 11/20/2018   CL 105 11/20/2018   CO2 30 11/20/2018    DM2 Lab Results  Component Value Date   HGBA1C 6.5 11/20/2018   This is down from 7.0- improved!  Has quit sweet tea completely ! occ she craves it  Also avoiding other sweets  Unsure if weight loss  She takes statin  Also ARB Eye exam 2/20- no retinopathy  Had to re schedule dentist No DM symptoms    Exercise- working out in the yard- very very active/more than usual  2 grandsons are there 81, 17 are with her  They are doing online learning      hyperlipidemia  Lab Results  Component Value Date   CHOL 128 11/20/2018   CHOL 145 10/01/2017   Lab Results  Component Value Date   HDL 33.20 (L) 11/20/2018   HDL 44.90 10/01/2017   Lab Results   Component Value Date   LDLCALC 75 11/20/2018   LDLCALC 79 10/01/2017   Lab Results  Component Value Date   TRIG 101.0 11/20/2018   TRIG 106.0 10/01/2017   Lab Results  Component Value Date   CHOLHDL 4 11/20/2018   CHOLHDL 3 10/01/2017   No results found for: LDLDIRECT  LDL remains under 80 Is more active now   Mood is ok  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  Eyes: Negative for blurred vision.  Respiratory: Negative for cough, shortness of breath and wheezing.   Cardiovascular: Negative for chest pain and palpitations.  Gastrointestinal: Negative for nausea.  Genitourinary: Negative for frequency.  Skin: Negative for itching and rash.  Neurological: Negative for dizziness and headaches.  Psychiatric/Behavioral: Negative for depression. The patient is not nervous/anxious.        Mood is stable despite stressors       Patient Active Problem List   Diagnosis Date Noted  . Type 2 diabetes mellitus without complications (HCC) 08/23/2018  . Hyperlipidemia 08/31/2017  . Smoker 08/31/2017  . Hypertension 08/31/2017  . Encounter for screening mammogram for breast cancer  08/31/2017  . Obesity (BMI 30-39.9) 08/31/2017  . Depression with anxiety 08/31/2017  . Insomnia 08/31/2017   Past Medical History:  Diagnosis Date  . Depression   . Diabetes mellitus without complication (HCC)   . Family history of heart murmur   . History of chickenpox   . History of colon polyps   . History of depression   . History of gastroesophageal reflux (GERD)   . History of hyperlipidemia   . History of urinary incontinence   . Hyperlipidemia   . Hypertension    Past Surgical History:  Procedure Laterality Date  . BREAST BIOPSY Right 2000   neg  . BREAST REDUCTION SURGERY  2010  . GALLBLADDER SURGERY  2009  . INCONTINENCE SURGERY  2009  . PARTIAL  HYSTERECTOMY  2009  . REDUCTION MAMMAPLASTY Bilateral 2000   Social History   Tobacco Use  . Smoking status: Current Every Day Smoker    Packs/day: 1.00    Years: 41.00    Pack years: 41.00  . Smokeless tobacco: Never Used  Substance Use Topics  . Alcohol use: No    Frequency: Never  . Drug use: No   Family History  Problem Relation Age of Onset  . Arthritis Mother   . Heart attack Mother   . Early death Mother   . Alcohol abuse Father   . Alzheimer's disease Father   . Non-Hodgkin's lymphoma Sister   . Depression Sister   . Alcohol abuse Brother   . COPD Brother   . Heart attack Brother   . Hyperlipidemia Brother   . Diabetes Maternal Grandmother   . Alcohol abuse Maternal Grandfather   . Alzheimer's disease Paternal Grandmother   . Drug abuse Son    Allergies  Allergen Reactions  . Requip [Ropinirole Hcl] Other (See Comments)    Tongue swelling  . Chantix [Varenicline] Rash   Current Outpatient Medications on File Prior to Visit  Medication Sig Dispense Refill  . ACCU-CHEK FASTCLIX LANCETS MISC Check sugar once daily and as needed for diabetes. DX: E11.9 90 each 3  . aspirin 325 MG tablet Take 325 mg by mouth daily.    Marland Kitchen atorvastatin (LIPITOR) 20 MG tablet Take 1 tablet (20 mg total) by mouth daily. 90 tablet 3  . Cholecalciferol (VITAMIN D3) 2000 units TABS Take 1 tablet by mouth daily.    Marland Kitchen glucose blood test strip Strip for Accu-check guide meter. Check sugar once daily and as needed for diabetes. DX:E11.9 90 each 3  . hydrochlorothiazide (HYDRODIURIL) 25 MG tablet Take 1 tablet (25 mg total) by mouth daily. 90 tablet 3  . losartan (COZAAR) 100 MG tablet Take 1 tablet (100 mg total) by mouth daily. 90 tablet 3  . Omega-3 Fatty Acids (FISH OIL) 1200 MG CAPS Take 1 capsule by mouth daily.    Marland Kitchen PARoxetine (PAXIL) 40 MG tablet Take 1 tablet (40 mg total) by mouth daily. 90 tablet 3  . vitamin E 400 UNIT capsule Take 400 Units by mouth daily.    Marland Kitchen zolpidem (AMBIEN)  10 MG tablet TAKE ONE TABLET BY MOUTH EVERY NIGHT AT BEDTIME AS NEEDED FOR SLEEP 30 tablet 1   No current facility-administered medications on file prior to visit.     Observations/Objective: Patient sounds cheerful  No indications of distress or fatigue  Does not sound depressed or anxious  No hoarseness or cough during interview  Mentally sharp    Assessment and Plan: Problem List Items Addressed This Visit  Cardiovascular and Mediastinum   Hypertension    Pt thinks BP has been stable -but does not have cuff at home No symptoms of high or low bp Continues arb        Endocrine   Type 2 diabetes mellitus without complications (HCC) - Primary    Lab Results  Component Value Date   HGBA1C 6.5 11/20/2018   Commended on improvement  Stopped sweets and sweet tea Unsure if she lost wt  Is more active also (in yard) No medications for blood sugar  She is taking arb and also statin  Continues to work on diet/exercise Enc to quit smoking utd eye exam-no retinopathy Good foot care F/u 6 mo        Other   Hyperlipidemia    Disc goals for lipids and reasons to control them Rev last labs with pt Rev low sat fat diet in detail Fairly stable  LDL in 80s  Expect HDL to improve with more exercise Will continue atorvastatin       Smoker    Disc in detail risks of smoking and possible outcomes including copd, vascular/ heart disease, cancer , respiratory and sinus infections  Pt voices understanding She is not interested in quitting at this time       Obesity (BMI 30-39.9)    Discussed how this problem influences overall health and the risks it imposes  Reviewed plan for weight loss with lower calorie diet (via better food choices and also portion control or program like weight watchers) and exercise building up to or more than 30 minutes 5 days per week including some aerobic activity   Commended on quitting sweets and sweet tea! Enc exercise       Depression  with anxiety    Despite being stuck at home-doing well  No loss of motivation or interest-very busy working outdoors  Seldom feels down grandkids are there also Continues paroxetine w/o problems       Insomnia        Follow Up Instructions:    I discussed the assessment and treatment plan with the patient. The patient was provided an opportunity to ask questions and all were answered. The patient agreed with the plan and demonstrated an understanding of the instructions.   The patient was advised to call back or seek an in-person evaluation if the symptoms worsen or if the condition fails to improve as anticipated.  I provided 18 minutes of non-face-to-face time during this encounter.   Roxy MannsMarne Charmelle Soh, MD

## 2018-11-25 NOTE — Assessment & Plan Note (Addendum)
Despite being stuck at home-doing well  No loss of motivation or interest-very busy working outdoors  Seldom feels down grandkids are there also Continues paroxetine w/o problems

## 2018-11-25 NOTE — Assessment & Plan Note (Signed)
Pt thinks BP has been stable -but does not have cuff at home No symptoms of high or low bp Continues arb

## 2018-11-25 NOTE — Assessment & Plan Note (Signed)
Discussed how this problem influences overall health and the risks it imposes  Reviewed plan for weight loss with lower calorie diet (via better food choices and also portion control or program like weight watchers) and exercise building up to or more than 30 minutes 5 days per week including some aerobic activity   Commended on quitting sweets and sweet tea! Enc exercise

## 2018-11-25 NOTE — Assessment & Plan Note (Signed)
Lab Results  Component Value Date   HGBA1C 6.5 11/20/2018   Commended on improvement  Stopped sweets and sweet tea Unsure if she lost wt  Is more active also (in yard) No medications for blood sugar  She is taking arb and also statin  Continues to work on diet/exercise Enc to quit smoking utd eye exam-no retinopathy Good foot care F/u 6 mo

## 2018-11-28 ENCOUNTER — Ambulatory Visit: Payer: PRIVATE HEALTH INSURANCE

## 2018-11-28 ENCOUNTER — Encounter: Payer: Self-pay | Admitting: *Deleted

## 2018-11-28 NOTE — Progress Notes (Signed)
Patient called this week and reported that she didn't want to complete diabetes classes. She reports that her doctor told her she is doing well. Will send discharge letter.

## 2018-12-02 ENCOUNTER — Encounter: Payer: Self-pay | Admitting: *Deleted

## 2018-12-05 ENCOUNTER — Ambulatory Visit: Payer: PRIVATE HEALTH INSURANCE

## 2018-12-19 ENCOUNTER — Ambulatory Visit: Payer: PRIVATE HEALTH INSURANCE

## 2018-12-26 ENCOUNTER — Ambulatory Visit: Payer: PRIVATE HEALTH INSURANCE

## 2019-01-22 ENCOUNTER — Other Ambulatory Visit: Payer: Self-pay | Admitting: Family Medicine

## 2019-01-22 NOTE — Telephone Encounter (Signed)
Name of [place] Name of Pharmacy:[facility]: [address] or Written Date and Quantity:10/04/18  #30 tabs with 1 refills Last Office Visit and Type:3 month f/u on 11/25/18 Next Office Visit and Type:none scheduled  Last Controlled Substance Agreement Date:no UDS Last UDS:no UDS

## 2019-02-26 ENCOUNTER — Encounter: Payer: Self-pay | Admitting: Family Medicine

## 2019-02-26 ENCOUNTER — Other Ambulatory Visit: Payer: Self-pay

## 2019-02-26 ENCOUNTER — Ambulatory Visit (INDEPENDENT_AMBULATORY_CARE_PROVIDER_SITE_OTHER)
Admission: RE | Admit: 2019-02-26 | Discharge: 2019-02-26 | Disposition: A | Discharge status: Home / Self Care | Payer: PRIVATE HEALTH INSURANCE | Source: Ambulatory Visit | Attending: Family Medicine | Admitting: Family Medicine

## 2019-02-26 ENCOUNTER — Ambulatory Visit (INDEPENDENT_AMBULATORY_CARE_PROVIDER_SITE_OTHER): Payer: PRIVATE HEALTH INSURANCE | Admitting: Family Medicine

## 2019-02-26 VITALS — BP 120/70 | HR 60 | Temp 97.8°F | Ht 66.0 in | Wt 217.0 lb

## 2019-02-26 DIAGNOSIS — M25522 Pain in left elbow: Secondary | ICD-10-CM | POA: Diagnosis not present

## 2019-02-26 DIAGNOSIS — M25622 Stiffness of left elbow, not elsewhere classified: Secondary | ICD-10-CM | POA: Diagnosis not present

## 2019-02-26 NOTE — Progress Notes (Signed)
Brittley Regner T. Kylo Gavin, MD Primary Care and Sports Medicine [facility] [address] [place], [id number] Phone: [phone]  FAX: [phone]  Kathleen Hernandez - 63 y.o. female  MRN [id number]  Date of Birth: 05/18/1956  Visit Date: 02/26/2019  PCP: Judy Pimpleower, Marne A, MD  Referred by: Tower, Audrie GallusMarne A, MD  Chief Complaint  Patient presents with  . Elbow Pain    left  . Knee Pain    both   Subjective:   Kathleen Hernandez is a 63 y.o. very pleasant female patient who presents with the following:  Trauma after fall 3 weeks ago, now with L elbow pain and B knee pain:  Left elbow, when fell through the deck.  At this point, this is the primary problem.  The patient has lost some extension and she has pain with terminal supination and pronation.  She does not have an elbow effusion at this point.  She thought that this might get better on its own, but it has gotten somewhat worse over time, 3 weeks after initial injury.  Past Medical History, Surgical History, Social History, Family History, Problem List, Medications, and Allergies have been reviewed and updated if relevant.  Patient Active Problem List   Diagnosis Date Noted  . Type 2 diabetes mellitus without complications (HCC) 08/23/2018  . Hyperlipidemia 08/31/2017  . Smoker 08/31/2017  . Hypertension 08/31/2017  . Encounter for screening mammogram for breast cancer 08/31/2017  . Obesity (BMI 30-39.9) 08/31/2017  . Depression with anxiety 08/31/2017  . Insomnia 08/31/2017    Past Medical History:  Diagnosis Date  . Depression   . Diabetes mellitus without complication (HCC)   . Family history of heart murmur   . History of chickenpox   . History of colon polyps   . History of depression   . History of gastroesophageal reflux (GERD)   . History of hyperlipidemia   . History of urinary incontinence   . Hyperlipidemia   . Hypertension     Past Surgical History:  Procedure  Laterality Date  . BREAST BIOPSY Right 2000   neg  . BREAST REDUCTION SURGERY  2010  . GALLBLADDER SURGERY  2009  . INCONTINENCE SURGERY  2009  . PARTIAL HYSTERECTOMY  2009  . REDUCTION MAMMAPLASTY Bilateral 2000    Social History   Socioeconomic History  . Marital status: Married    Spouse name: Not on file  . Number of children: Not on file  . Years of education: Not on file  . Highest education level: Not on file  Occupational History  . Not on file  Social Needs  . Financial resource strain: Not on file  . Food insecurity    Worry: Not on file    Inability: Not on file  . Transportation needs    Medical: Not on file    Non-medical: Not on file  Tobacco Use  . Smoking status: Current Every Day Smoker    Packs/day: 1.00    Years: 41.00    Pack years: 41.00  . Smokeless tobacco: Never Used  Substance and Sexual Activity  . Alcohol use: No    Frequency: Never  . Drug use: No  . Sexual activity: Not on file  Lifestyle  . Physical activity    Days per week: Not on file    Minutes per session: Not on file  . Stress: Not on file  Relationships  . Social connections    Talks  on phone: Not on file    Gets together: Not on file    Attends religious service: Not on file    Active member of club or organization: Not on file    Attends meetings of clubs or organizations: Not on file    Relationship status: Not on file  . Intimate partner violence    Fear of current or ex partner: Not on file    Emotionally abused: Not on file    Physically abused: Not on file    Forced sexual activity: Not on file  Other Topics Concern  . Not on file  Social History Narrative  . Not on file    Family History  Problem Relation Age of Onset  . Arthritis Mother   . Heart attack Mother   . Early death Mother   . Alcohol abuse Father   . Alzheimer's disease Father   . Non-Hodgkin's lymphoma Sister   . Depression Sister   . Alcohol abuse Brother   . COPD Brother   . Heart  attack Brother   . Hyperlipidemia Brother   . Diabetes Maternal Grandmother   . Alcohol abuse Maternal Grandfather   . Alzheimer's disease Paternal Grandmother   . Drug abuse Son     Allergies  Allergen Reactions  . Requip [Ropinirole Hcl] Other (See Comments)    Tongue swelling  . Chantix [Varenicline] Rash    Medication list reviewed and updated in full in [place].  GEN: No fevers, chills. Nontoxic. Primarily MSK c/o today. MSK: Detailed in the HPI GI: tolerating PO intake without difficulty Neuro: No numbness, parasthesias, or tingling associated. Otherwise the pertinent positives of the ROS are noted above.   Objective:   BP 120/70   Pulse 60   Temp 97.8 F (36.6 C) (Skin)   Ht 5\' 6"  (1.676 m)   Wt 217 lb (98.4 kg)   SpO2 96%   BMI 35.02 kg/m    GEN: WDWN, NAD, Non-toxic, Alert & Oriented x 3 HEENT: Atraumatic, Normocephalic.  Ears and Nose: No external deformity. EXTR: No clubbing/cyanosis/edema NEURO: Normal gait.  PSYCH: Normally interactive. Conversant. Not depressed or anxious appearing.  Calm demeanor.    Left arm and elbow.  The patient is nontender throughout the entirety of the hand and wrist.  The distal ulna and radius as well as the midshaft are nontender.  The humerus is nontender in its entirety.  The shoulder is nontender and has full range of motion and 5/5 strength.  The elbow lacks approximately 10 degrees of extension.  Does have full flexion with some pain at terminal flexion.  Patient is able to grossly have full supination and pronation, but terminal pain.  She does have some pain and what appears to be the lateral epicondyle, but does not appear to have pain in the ECRB.  Radiology: Dg Elbow Complete Left (3+view)  Result Date: 02/26/2019 CLINICAL DATA:  Fall 3 weeks ago with persistent pain, initial encounter EXAM: LEFT ELBOW - COMPLETE 3+ VIEW COMPARISON:  None. FINDINGS: There is no evidence of fracture, dislocation, or  joint effusion. There is no evidence of arthropathy or other focal bone abnormality. Soft tissues are unremarkable. IMPRESSION: No acute abnormality noted. Electronically Signed   By: Inez Catalina M.D.   On: 02/26/2019 11:41    Assessment and Plan:     ICD-10-CM   1. Decreased range of motion of left elbow  M25.622 Ambulatory referral to Occupational Therapy  2. Elbow pain, left  M25.522 DG ELBOW COMPLETE LEFT (3+VIEW)    Ambulatory referral to Occupational Therapy   There is no evidence of acute fracture.  She did traumatize her elbow 3 weeks ago and now she has limitation on extension.  I am good to have her do range of motion throughout the day starting now and have her start occupational therapy with close follow-up.  Follow-up: Return in about 6 weeks (around 04/09/2019).  No orders of the defined types were placed in this encounter.  Orders Placed This Encounter  Procedures  . DG ELBOW COMPLETE LEFT (3+VIEW)  . Ambulatory referral to Occupational Therapy    Signed,  Karleen HampshireSpencer T. Sundi Slevin, MD   Outpatient Encounter Medications as of 02/26/2019  Medication Sig  . ACCU-CHEK FASTCLIX LANCETS MISC Check sugar once daily and as needed for diabetes. DX: E11.9  . aspirin 325 MG tablet Take 325 mg by mouth daily.  Marland Kitchen. atorvastatin (LIPITOR) 20 MG tablet Take 1 tablet (20 mg total) by mouth daily.  . Cholecalciferol (VITAMIN D3) 2000 units TABS Take 1 tablet by mouth daily.  Marland Kitchen. glucose blood test strip Strip for Accu-check guide meter. Check sugar once daily and as needed for diabetes. DX:E11.9  . hydrochlorothiazide (HYDRODIURIL) 25 MG tablet Take 1 tablet (25 mg total) by mouth daily.  Marland Kitchen. losartan (COZAAR) 100 MG tablet Take 1 tablet (100 mg total) by mouth daily.  . Omega-3 Fatty Acids (FISH OIL) 1200 MG CAPS Take 1 capsule by mouth daily.  Marland Kitchen. PARoxetine (PAXIL) 40 MG tablet Take 1 tablet (40 mg total) by mouth daily.  . vitamin E 400 UNIT capsule Take 400 Units by mouth daily.  Marland Kitchen.  zolpidem (AMBIEN) 10 MG tablet TAKE ONE TABLET BY MOUTH EVERY NIGHT AT BEDTIME AS NEEDED FOR SLEEP   No facility-administered encounter medications on file as of 02/26/2019.

## 2019-02-28 ENCOUNTER — Other Ambulatory Visit: Payer: Self-pay

## 2019-02-28 DIAGNOSIS — Z20822 Contact with and (suspected) exposure to covid-19: Secondary | ICD-10-CM

## 2019-03-04 LAB — NOVEL CORONAVIRUS, NAA: SARS-CoV-2, NAA: NOT DETECTED

## 2019-03-07 ENCOUNTER — Telehealth: Payer: Self-pay | Admitting: Family Medicine

## 2019-03-07 NOTE — Telephone Encounter (Signed)
Called patient to schedule the OT and she said she doesn't want the Referral she is feeling better without pain. Cancelling Referral.

## 2019-04-08 ENCOUNTER — Telehealth: Payer: Self-pay | Admitting: Family Medicine

## 2019-04-08 NOTE — Telephone Encounter (Signed)
Pt put in a request for a pneumonia shot. Is this ok to schedule?

## 2019-04-08 NOTE — Telephone Encounter (Signed)
Looks like she had pna 23  In 2/19? So she does not need one yet (and she is under 35)

## 2019-04-09 ENCOUNTER — Ambulatory Visit: Payer: PRIVATE HEALTH INSURANCE | Admitting: Family Medicine

## 2019-04-29 ENCOUNTER — Ambulatory Visit (INDEPENDENT_AMBULATORY_CARE_PROVIDER_SITE_OTHER): Payer: PRIVATE HEALTH INSURANCE

## 2019-04-29 DIAGNOSIS — Z23 Encounter for immunization: Secondary | ICD-10-CM

## 2019-07-28 ENCOUNTER — Telehealth: Payer: Self-pay | Admitting: Family Medicine

## 2019-07-28 DIAGNOSIS — E7800 Pure hypercholesterolemia, unspecified: Secondary | ICD-10-CM

## 2019-07-28 DIAGNOSIS — I10 Essential (primary) hypertension: Secondary | ICD-10-CM

## 2019-07-28 DIAGNOSIS — E119 Type 2 diabetes mellitus without complications: Secondary | ICD-10-CM

## 2019-07-28 NOTE — Telephone Encounter (Signed)
-----   Message from Ellamae Sia sent at 07/22/2019  2:40 PM EST ----- Regarding: lab orders for Tuesday, 12.15.20 Patient is scheduled for CPX labs, please order future labs, Thanks , Karna Christmas

## 2019-07-29 ENCOUNTER — Other Ambulatory Visit (INDEPENDENT_AMBULATORY_CARE_PROVIDER_SITE_OTHER): Payer: PRIVATE HEALTH INSURANCE

## 2019-07-29 ENCOUNTER — Other Ambulatory Visit: Payer: Self-pay

## 2019-07-29 DIAGNOSIS — I10 Essential (primary) hypertension: Secondary | ICD-10-CM

## 2019-07-29 DIAGNOSIS — E119 Type 2 diabetes mellitus without complications: Secondary | ICD-10-CM

## 2019-07-29 DIAGNOSIS — E7800 Pure hypercholesterolemia, unspecified: Secondary | ICD-10-CM

## 2019-07-29 LAB — COMPREHENSIVE METABOLIC PANEL
ALT: 38 U/L — ABNORMAL HIGH (ref 0–35)
AST: 26 U/L (ref 0–37)
Albumin: 3.6 g/dL (ref 3.5–5.2)
Alkaline Phosphatase: 103 U/L (ref 39–117)
BUN: 15 mg/dL (ref 6–23)
CO2: 32 mEq/L (ref 19–32)
Calcium: 9.1 mg/dL (ref 8.4–10.5)
Chloride: 102 mEq/L (ref 96–112)
Creatinine, Ser: 0.66 mg/dL (ref 0.40–1.20)
GFR: 90.37 mL/min (ref >60.00)
Glucose, Bld: 133 mg/dL — ABNORMAL HIGH (ref 70–99)
Potassium: 4.1 mEq/L (ref 3.5–5.1)
Sodium: 140 mEq/L (ref 135–145)
Total Bilirubin: 0.6 mg/dL (ref 0.2–1.2)
Total Protein: 6.6 g/dL (ref 6.0–8.3)

## 2019-07-29 LAB — CBC WITH DIFFERENTIAL/PLATELET
Basophils Absolute: 0.1 10*3/uL (ref 0.0–0.1)
Basophils Relative: 0.6 % (ref 0.0–3.0)
Eosinophils Absolute: 0.2 10*3/uL (ref 0.0–0.7)
Eosinophils Relative: 1.7 % (ref 0.0–5.0)
HCT: 44.3 % (ref 36.0–46.0)
Hemoglobin: 14.8 g/dL (ref 12.0–15.0)
Lymphocytes Relative: 21.2 % (ref 12.0–46.0)
Lymphs Abs: 2.3 10*3/uL (ref 0.7–4.0)
MCHC: 33.3 g/dL (ref 30.0–36.0)
MCV: 96 fl (ref 78.0–100.0)
Monocytes Absolute: 0.9 10*3/uL (ref 0.1–1.0)
Monocytes Relative: 7.8 % (ref 3.0–12.0)
Neutro Abs: 7.5 10*3/uL (ref 1.4–7.7)
Neutrophils Relative %: 68.7 % (ref 43.0–77.0)
Platelets: 290 10*3/uL (ref 150.0–400.0)
RBC: 4.62 Mil/uL (ref 3.87–5.11)
RDW: 12.8 % (ref 11.5–15.5)
WBC: 10.9 10*3/uL — ABNORMAL HIGH (ref 4.0–10.5)

## 2019-07-29 LAB — LIPID PANEL
Cholesterol: 138 mg/dL (ref 0–200)
HDL: 42.9 mg/dL (ref >39.00)
LDL Cholesterol: 75 mg/dL (ref 0–99)
NonHDL: 95.41
Total CHOL/HDL Ratio: 3
Triglycerides: 102 mg/dL (ref 0.0–149.0)
VLDL: 20.4 mg/dL (ref 0.0–40.0)

## 2019-07-29 LAB — HEMOGLOBIN A1C: Hgb A1c MFr Bld: 6.9 % — ABNORMAL HIGH (ref 4.6–6.5)

## 2019-07-29 LAB — TSH: TSH: 3.01 u[IU]/mL (ref 0.35–4.50)

## 2019-08-05 ENCOUNTER — Encounter: Payer: Self-pay | Admitting: Family Medicine

## 2019-08-05 ENCOUNTER — Ambulatory Visit (INDEPENDENT_AMBULATORY_CARE_PROVIDER_SITE_OTHER): Payer: PRIVATE HEALTH INSURANCE | Admitting: Family Medicine

## 2019-08-05 ENCOUNTER — Other Ambulatory Visit: Payer: Self-pay

## 2019-08-05 VITALS — BP 110/70 | HR 56 | Temp 97.6°F | Ht 65.75 in | Wt 224.0 lb

## 2019-08-05 DIAGNOSIS — F418 Other specified anxiety disorders: Secondary | ICD-10-CM

## 2019-08-05 DIAGNOSIS — Z0001 Encounter for general adult medical examination with abnormal findings: Secondary | ICD-10-CM | POA: Insufficient documentation

## 2019-08-05 DIAGNOSIS — E119 Type 2 diabetes mellitus without complications: Secondary | ICD-10-CM

## 2019-08-05 DIAGNOSIS — E785 Hyperlipidemia, unspecified: Secondary | ICD-10-CM

## 2019-08-05 DIAGNOSIS — I10 Essential (primary) hypertension: Secondary | ICD-10-CM

## 2019-08-05 DIAGNOSIS — E669 Obesity, unspecified: Secondary | ICD-10-CM

## 2019-08-05 DIAGNOSIS — Z0000 Encounter for general adult medical examination without abnormal findings: Secondary | ICD-10-CM

## 2019-08-05 DIAGNOSIS — Z1231 Encounter for screening mammogram for malignant neoplasm of breast: Secondary | ICD-10-CM | POA: Diagnosis not present

## 2019-08-05 DIAGNOSIS — F17200 Nicotine dependence, unspecified, uncomplicated: Secondary | ICD-10-CM

## 2019-08-05 DIAGNOSIS — E1169 Type 2 diabetes mellitus with other specified complication: Secondary | ICD-10-CM

## 2019-08-05 MED ORDER — PAROXETINE HCL 40 MG PO TABS: 40.0000 mg | ORAL_TABLET | Freq: Every day | ORAL | 3 refills | Status: DC

## 2019-08-05 MED ORDER — HYDROCHLOROTHIAZIDE 25 MG PO TABS: 25.0000 mg | ORAL_TABLET | Freq: Every day | ORAL | 3 refills | Status: DC

## 2019-08-05 MED ORDER — ATORVASTATIN CALCIUM 20 MG PO TABS: 20.0000 mg | ORAL_TABLET | Freq: Every day | ORAL | 3 refills | Status: DC

## 2019-08-05 MED ORDER — LOSARTAN POTASSIUM 100 MG PO TABS: 100.0000 mg | ORAL_TABLET | Freq: Every day | ORAL | 3 refills | Status: DC

## 2019-08-05 NOTE — Assessment & Plan Note (Signed)
Disc goals for lipids and reasons to control them Rev last labs with pt Rev low sat fat diet in detail  Controlled with atorvastatin  

## 2019-08-05 NOTE — Assessment & Plan Note (Signed)
Lab Results  Component Value Date   HGBA1C 6.9 (H) 07/29/2019   Discussed diet disc imp of low glycemic diet and wt loss to prevent DM2  Would like to avoid medication  Plans to work on her own to get it down  On statin and arb  Nl foot exam  Eye exam due in feb

## 2019-08-05 NOTE — Assessment & Plan Note (Signed)
bp is stable today  No cp or palpitations or headaches or edema  No side effects to medicines  BP Readings from Last 3 Encounters:  08/05/19 110/70  02/26/19 120/70  09/11/18 112/70

## 2019-08-05 NOTE — Assessment & Plan Note (Signed)
Reviewed health habits including diet and exercise and skin cancer prevention Reviewed appropriate screening tests for age  Also reviewed health mt list, fam hx and immunization status , as well as social and family history   See HPI Labs reviewed  Pt plans to schedule mammogram  Declines shingrix for now  Counseled re: smoking cessation  Also need for wt loss with healthy diet and exercise

## 2019-08-05 NOTE — Assessment & Plan Note (Signed)
Discussed how this problem influences overall health and the risks it imposes  Reviewed plan for weight loss with lower calorie diet (via better food choices and also portion control or program like weight watchers) and exercise building up to or more than 30 minutes 5 days per week including some aerobic activity    

## 2019-08-05 NOTE — Assessment & Plan Note (Signed)
Pt plans to schedule her mammogram and phone # given Enc self breast exams Nl exam today

## 2019-08-05 NOTE — Patient Instructions (Addendum)
If you are interested in the new shingles vaccine (Shingrix) - call your local pharmacy to check on coverage and availability  If affordable, get on a wait list at your pharmacy to get the vaccine.  Stay physically active  30 or more minutes per day of extra exercise   Also back off sweets and sweet drinks  Try to get most of your carbohydrates from produce (with the exception of white potatoes)  Eat less bread/pasta/rice/snack foods/cereals/sweets and other items from the middle of the grocery store (processed carbs)  Weight loss helps also   I want to get your A1C a little lower   If you want to talk to a grief counselor let us or hospice know   Think about quitting smoking

## 2019-08-05 NOTE — Assessment & Plan Note (Signed)
Some grief rxn with recent loss of her father  Overall doing ok Declines counseling Continues paxil and [place]

## 2019-08-05 NOTE — Progress Notes (Signed)
Subjective:    Patient ID: Kathleen Hernandez, female    DOB: [birth year], 63 y.o.   MRN: [id number]  This visit occurred during the SARS-CoV-2 public health emergency.  Safety protocols were in place, including screening questions prior to the visit, additional usage of staff PPE, and extensive cleaning of exam room while observing appropriate contact time as indicated for disinfecting solutions.    HPI Here for health maintenance exam and to review chronic medical problems    Has been feeling pretty good   Father just died - has been rough  Taking care of herself  Supportive family   Wt Readings from Last 3 Encounters:  08/05/19 224 lb (101.6 kg)  02/26/19 217 lb (98.4 kg)  09/26/18 221 lb 6.4 oz (100.4 kg)   36.43 kg/m  Appetite is ok  Eats average  Exercise- outdoors /yard when weather permits    Mammogram 2/19-is open to getting one yearly  Will schedule her own at [facility] breast exam -no lumps   Gyn care -had pap 1/17  Hysterectomy -for bleeding in past  No complaints   Colonoscopy 11/14 No bowel changes   Tdap 2/19  PNA - 2/19 vaccine   Flu shot 9/20   Zoster status -not interested in a vaccine currently   Smoking status -- 1ppd Not considering cessation at this time  Not interested in lung cancer screening   bp is stable today  2nd check BP: 110/70   No cp or palpitations or headaches or edema  No side effects to medicines  BP Readings from Last 3 Encounters:  08/05/19 (!) 92/56  02/26/19 120/70  09/11/18 112/70    No palpitations  No dizziness   DM 2  Lab Results  Component Value Date   HGBA1C 6.9 (H) 07/29/2019  has not been on medication  This is up from 6.5 last time  Diet -not as good/ had sweets and sweet drinks since funeral  Was doing much better until recently  Glucose 133 fasting    Taking arb and statin  Eye exam 2/20  Hyperlipidemia  Lab Results  Component Value Date   CHOL 138 07/29/2019   CHOL 128 11/20/2018   CHOL 145 10/01/2017   Lab Results  Component Value Date   HDL 42.90 07/29/2019   HDL 33.20 (L) 11/20/2018   HDL 44.90 10/01/2017   Lab Results  Component Value Date   LDLCALC 75 07/29/2019   LDLCALC 75 11/20/2018   LDLCALC 79 10/01/2017   Lab Results  Component Value Date   TRIG 102.0 07/29/2019   TRIG 101.0 11/20/2018   TRIG 106.0 10/01/2017   Lab Results  Component Value Date   CHOLHDL 3 07/29/2019   CHOLHDL 4 11/20/2018   CHOLHDL 3 10/01/2017   No results found for: LDLDIRECT Taking atorvastatin 20  Controlled   Takes 325 mg asa daily   Mood:- going through grief right now  Takes paxil  (long time)  Also ambien for sleep  Patient Active Problem List   Diagnosis Date Noted  . Routine general medical examination at a health care facility 08/05/2019  . Type 2 diabetes mellitus without complications ([place]) 16/02/3709  . Hyperlipidemia associated with type 2 diabetes mellitus ([facility]) 08/31/2017  . Smoker 08/31/2017  . Hypertension 08/31/2017  . Encounter for screening mammogram for breast cancer 08/31/2017  . Obesity (BMI 30-39.9) 08/31/2017  . Depression with anxiety 08/31/2017  . Insomnia 08/31/2017   Past Medical History:  Diagnosis Date  .  Depression   . Diabetes mellitus without complication (HCC)   . Family history of heart murmur   . History of chickenpox   . History of colon polyps   . History of depression   . History of gastroesophageal reflux (GERD)   . History of hyperlipidemia   . History of urinary incontinence   . Hyperlipidemia   . Hypertension    Past Surgical History:  Procedure Laterality Date  . BREAST BIOPSY Right 2000   neg  . BREAST REDUCTION SURGERY  2010  . GALLBLADDER SURGERY  2009  . INCONTINENCE SURGERY  2009  . PARTIAL HYSTERECTOMY  2009  . REDUCTION MAMMAPLASTY Bilateral 2000   Social History   Tobacco Use  . Smoking status: Current Every Day Smoker    Packs/day: 1.00    Years: 41.00    Pack years: 41.00  .  Smokeless tobacco: Never Used  Substance Use Topics  . Alcohol use: No  . Drug use: No   Family History  Problem Relation Age of Onset  . Arthritis Mother   . Heart attack Mother   . Early death Mother   . Alcohol abuse Father   . Alzheimer's disease Father   . Non-Hodgkin's lymphoma Sister   . Depression Sister   . Alcohol abuse Brother   . COPD Brother   . Heart attack Brother   . Hyperlipidemia Brother   . Diabetes Maternal Grandmother   . Alcohol abuse Maternal Grandfather   . Alzheimer's disease Paternal Grandmother   . Drug abuse Son    Allergies  Allergen Reactions  . Requip [Ropinirole Hcl] Other (See Comments)    Tongue swelling  . Chantix [Varenicline] Rash   Current Outpatient Medications on File Prior to Visit  Medication Sig Dispense Refill  . ACCU-CHEK FASTCLIX LANCETS MISC Check sugar once daily and as needed for diabetes. DX: E11.9 90 each 3  . aspirin 325 MG tablet Take 325 mg by mouth daily.    . Cholecalciferol (VITAMIN D3) 2000 units TABS Take 1 tablet by mouth daily.    Marland Kitchen. glucose blood test strip Strip for Accu-check guide meter. Check sugar once daily and as needed for diabetes. DX:E11.9 90 each 3  . Omega-3 Fatty Acids (FISH OIL) 1200 MG CAPS Take 1 capsule by mouth daily.    . vitamin E 400 UNIT capsule Take 400 Units by mouth daily.    Marland Kitchen. zolpidem (AMBIEN) 10 MG tablet TAKE ONE TABLET BY MOUTH EVERY NIGHT AT BEDTIME AS NEEDED FOR SLEEP 30 tablet 3   No current facility-administered medications on file prior to visit.     Review of Systems  Constitutional: Negative for activity change, appetite change, fatigue, fever and unexpected weight change.  HENT: Negative for congestion, ear pain, rhinorrhea, sinus pressure and sore throat.   Eyes: Negative for pain, redness and visual disturbance.  Respiratory: Negative for cough, shortness of breath and wheezing.   Cardiovascular: Negative for chest pain and palpitations.  Gastrointestinal: Negative for  abdominal pain, blood in stool, constipation and diarrhea.  Endocrine: Negative for polydipsia and polyuria.  Genitourinary: Negative for dysuria, frequency and urgency.  Musculoskeletal: Negative for arthralgias, back pain and myalgias.  Skin: Negative for pallor and rash.  Allergic/Immunologic: Negative for environmental allergies.  Neurological: Negative for dizziness, syncope and headaches.  Hematological: Negative for adenopathy. Does not bruise/bleed easily.  Psychiatric/Behavioral: Negative for decreased concentration and dysphoric mood. The patient is not nervous/anxious.        Grief  Objective:   Physical Exam Constitutional:      General: She is not in acute distress.    Appearance: Normal appearance. She is well-developed. She is obese. She is not ill-appearing or diaphoretic.  HENT:     Head: Normocephalic and atraumatic.     Right Ear: Tympanic membrane, ear canal and external ear normal.     Left Ear: Tympanic membrane, ear canal and external ear normal.     Nose: Nose normal. No congestion.     Mouth/Throat:     Mouth: Mucous membranes are moist.     Pharynx: Oropharynx is clear. No posterior oropharyngeal erythema.  Eyes:     General: No scleral icterus.    Extraocular Movements: Extraocular movements intact.     Conjunctiva/sclera: Conjunctivae normal.     Pupils: Pupils are equal, round, and reactive to light.  Neck:     Thyroid: No thyromegaly.     Vascular: No carotid bruit or JVD.  Cardiovascular:     Rate and Rhythm: Normal rate and regular rhythm.     Pulses: Normal pulses.     Heart sounds: Normal heart sounds. No gallop.   Pulmonary:     Effort: Pulmonary effort is normal. No respiratory distress.     Breath sounds: Normal breath sounds. No wheezing.     Comments: Good air exch Chest:     Chest wall: No tenderness.  Abdominal:     General: Bowel sounds are normal. There is no distension or abdominal bruit.     Palpations: Abdomen is soft.  There is no mass.     Tenderness: There is no abdominal tenderness.     Hernia: No hernia is present.  Genitourinary:    Comments: Breast exam: No mass, nodules, thickening, tenderness, bulging, retraction, inflamation, nipple discharge or skin changes noted.  No axillary or clavicular LA.     Baseline scars from breast reduction surgery noted Musculoskeletal:        General: No tenderness. Normal range of motion.     Cervical back: Normal range of motion and neck supple. No rigidity. No muscular tenderness.     Right lower leg: No edema.     Left lower leg: No edema.  Lymphadenopathy:     Cervical: No cervical adenopathy.  Skin:    General: Skin is warm and dry.     Coloration: Skin is not pale.     Findings: No erythema or rash.     Comments: Solar lentigines diffusely Some sks  Olive complexion  Neurological:     Mental Status: She is alert. Mental status is at baseline.     Cranial Nerves: No cranial nerve deficit.     Motor: No abnormal muscle tone.     Coordination: Coordination normal.     Gait: Gait normal.     Deep Tendon Reflexes: Reflexes are normal and symmetric. Reflexes normal.  Psychiatric:        Mood and Affect: Mood normal.        Cognition and Memory: Cognition and memory normal.           Assessment & Plan:   Problem List Items Addressed This Visit      Cardiovascular and Mediastinum   Hypertension    bp is stable today  No cp or palpitations or headaches or edema  No side effects to medicines  BP Readings from Last 3 Encounters:  08/05/19 110/70  02/26/19 120/70  09/11/18 112/70  Relevant Medications   atorvastatin (LIPITOR) 20 MG tablet   hydrochlorothiazide (HYDRODIURIL) 25 MG tablet   losartan (COZAAR) 100 MG tablet     Endocrine   Hyperlipidemia associated with type 2 diabetes mellitus (HCC)    Disc goals for lipids and reasons to control them Rev last labs with pt Rev low sat fat diet in detail Controlled with  atorvastatin       Relevant Medications   atorvastatin (LIPITOR) 20 MG tablet   losartan (COZAAR) 100 MG tablet   Type 2 diabetes mellitus without complications (HCC)    Lab Results  Component Value Date   HGBA1C 6.9 (H) 07/29/2019   Discussed diet disc imp of low glycemic diet and wt loss to prevent DM2  Would like to avoid medication  Plans to work on her own to get it down  On statin and arb  Nl foot exam  Eye exam due in feb       Relevant Medications   atorvastatin (LIPITOR) 20 MG tablet   losartan (COZAAR) 100 MG tablet     Other   Smoker    Disc in detail risks of smoking and possible outcomes including copd, vascular/ heart disease, cancer , respiratory and sinus infections  Pt voices understanding  She is not ready to quit yet       Encounter for screening mammogram for breast cancer    Pt plans to schedule her mammogram and phone # given Enc self breast exams Nl exam today      Obesity (BMI 30-39.9)    Discussed how this problem influences overall health and the risks it imposes  Reviewed plan for weight loss with lower calorie diet (via better food choices and also portion control or program like weight watchers) and exercise building up to or more than 30 minutes 5 days per week including some aerobic activity         Depression with anxiety    Some grief rxn with recent loss of her father  Overall doing ok Declines counseling Continues paxil and ambien      Relevant Medications   PARoxetine (PAXIL) 40 MG tablet   Routine general medical examination at a health care facility - Primary    Reviewed health habits including diet and exercise and skin cancer prevention Reviewed appropriate screening tests for age  Also reviewed health mt list, fam hx and immunization status , as well as social and family history   See HPI Labs reviewed  Pt plans to schedule mammogram  Declines shingrix for now  Counseled re: smoking cessation  Also need for wt  loss with healthy diet and exercise

## 2019-08-05 NOTE — Assessment & Plan Note (Signed)
Disc in detail risks of smoking and possible outcomes including copd, vascular/ heart disease, cancer , respiratory and sinus infections  Pt voices understanding She is not ready to quit yet  

## 2019-08-29 ENCOUNTER — Other Ambulatory Visit: Payer: Self-pay | Admitting: Family Medicine

## 2019-08-29 NOTE — Telephone Encounter (Signed)
Name of Medication:Ambien Name of Pharmacy:[facility] [address] Last [place] or Written Date and Quantity:01/22/19  #30 tabs with3refills Last Office Visit and Type:CPE on 08/05/19 Next Office Visit and Type:f/u on 02/10/19 Last Controlled Substance Agreement Date:no UDS Last UDS:no UDS

## 2019-09-20 ENCOUNTER — Other Ambulatory Visit: Payer: Self-pay | Admitting: Family Medicine

## 2020-02-03 ENCOUNTER — Other Ambulatory Visit (INDEPENDENT_AMBULATORY_CARE_PROVIDER_SITE_OTHER): Payer: PRIVATE HEALTH INSURANCE

## 2020-02-03 ENCOUNTER — Other Ambulatory Visit: Payer: Self-pay

## 2020-02-03 DIAGNOSIS — E785 Hyperlipidemia, unspecified: Secondary | ICD-10-CM

## 2020-02-03 DIAGNOSIS — E1169 Type 2 diabetes mellitus with other specified complication: Secondary | ICD-10-CM | POA: Diagnosis not present

## 2020-02-03 DIAGNOSIS — I10 Essential (primary) hypertension: Secondary | ICD-10-CM | POA: Diagnosis not present

## 2020-02-03 DIAGNOSIS — E119 Type 2 diabetes mellitus without complications: Secondary | ICD-10-CM | POA: Diagnosis not present

## 2020-02-03 LAB — LIPID PANEL
Cholesterol: 142 mg/dL (ref 0–200)
HDL: 43.2 mg/dL (ref >39.00)
LDL Cholesterol: 76 mg/dL (ref 0–99)
NonHDL: 98.91
Total CHOL/HDL Ratio: 3
Triglycerides: 115 mg/dL (ref 0.0–149.0)
VLDL: 23 mg/dL (ref 0.0–40.0)

## 2020-02-03 LAB — COMPREHENSIVE METABOLIC PANEL
ALT: 20 U/L (ref 0–35)
AST: 27 U/L (ref 0–37)
Albumin: 3.8 g/dL (ref 3.5–5.2)
Alkaline Phosphatase: 104 U/L (ref 39–117)
BUN: 17 mg/dL (ref 6–23)
CO2: 32 mEq/L (ref 19–32)
Calcium: 9.1 mg/dL (ref 8.4–10.5)
Chloride: 103 mEq/L (ref 96–112)
Creatinine, Ser: 0.7 mg/dL (ref 0.40–1.20)
GFR: 84.3 mL/min (ref >60.00)
Glucose, Bld: 143 mg/dL — ABNORMAL HIGH (ref 70–99)
Potassium: 4.2 mEq/L (ref 3.5–5.1)
Sodium: 139 mEq/L (ref 135–145)
Total Bilirubin: 0.6 mg/dL (ref 0.2–1.2)
Total Protein: 6.4 g/dL (ref 6.0–8.3)

## 2020-02-03 LAB — HEMOGLOBIN A1C: Hgb A1c MFr Bld: 6.9 % — ABNORMAL HIGH (ref 4.6–6.5)

## 2020-02-10 ENCOUNTER — Other Ambulatory Visit: Payer: Self-pay

## 2020-02-10 ENCOUNTER — Telehealth: Payer: Self-pay | Admitting: Family Medicine

## 2020-02-10 ENCOUNTER — Ambulatory Visit (INDEPENDENT_AMBULATORY_CARE_PROVIDER_SITE_OTHER)
Admission: RE | Admit: 2020-02-10 | Discharge: 2020-02-10 | Disposition: A | Discharge status: Home / Self Care | Payer: PRIVATE HEALTH INSURANCE | Source: Ambulatory Visit | Attending: Family Medicine | Admitting: Family Medicine

## 2020-02-10 ENCOUNTER — Ambulatory Visit (INDEPENDENT_AMBULATORY_CARE_PROVIDER_SITE_OTHER): Payer: PRIVATE HEALTH INSURANCE | Admitting: Family Medicine

## 2020-02-10 ENCOUNTER — Encounter: Payer: Self-pay | Admitting: Family Medicine

## 2020-02-10 VITALS — BP 102/66 | HR 43 | Temp 96.9°F | Ht 66.0 in | Wt 227.3 lb

## 2020-02-10 DIAGNOSIS — E119 Type 2 diabetes mellitus without complications: Secondary | ICD-10-CM

## 2020-02-10 DIAGNOSIS — E669 Obesity, unspecified: Secondary | ICD-10-CM

## 2020-02-10 DIAGNOSIS — G8929 Other chronic pain: Secondary | ICD-10-CM

## 2020-02-10 DIAGNOSIS — I10 Essential (primary) hypertension: Secondary | ICD-10-CM

## 2020-02-10 DIAGNOSIS — F17200 Nicotine dependence, unspecified, uncomplicated: Secondary | ICD-10-CM

## 2020-02-10 DIAGNOSIS — M25562 Pain in left knee: Secondary | ICD-10-CM

## 2020-02-10 DIAGNOSIS — M25561 Pain in right knee: Secondary | ICD-10-CM | POA: Diagnosis not present

## 2020-02-10 DIAGNOSIS — E785 Hyperlipidemia, unspecified: Secondary | ICD-10-CM

## 2020-02-10 DIAGNOSIS — E1169 Type 2 diabetes mellitus with other specified complication: Secondary | ICD-10-CM | POA: Diagnosis not present

## 2020-02-10 DIAGNOSIS — R32 Unspecified urinary incontinence: Secondary | ICD-10-CM | POA: Diagnosis not present

## 2020-02-10 DIAGNOSIS — N3946 Mixed incontinence: Secondary | ICD-10-CM | POA: Insufficient documentation

## 2020-02-10 LAB — POCT URINALYSIS DIPSTICK
Bilirubin, UA: NEGATIVE
Blood, UA: NEGATIVE
Glucose, UA: NEGATIVE
Ketones, UA: NEGATIVE
Leukocytes, UA: NEGATIVE
Nitrite, UA: NEGATIVE
Protein, UA: NEGATIVE
Spec Grav, UA: >=1.03 — AB (ref 1.010–1.025)
Urobilinogen, UA: 0.2 E.U./dL
pH, UA: 6 (ref 5.0–8.0)

## 2020-02-10 MED ORDER — ESTRADIOL 0.1 MG/GM VA CREA: TOPICAL_CREAM | VAGINAL | 1 refills | Status: DC

## 2020-02-10 NOTE — Assessment & Plan Note (Signed)
Lab Results  Component Value Date   HGBA1C 6.9 (H) 02/03/2020   Stable without glycemic medication (she declines)  Diet is fair-counseled on this  Taking statin and arb  Eye exam scheduled in July  Nl foot exam F/u 6 mo

## 2020-02-10 NOTE — Assessment & Plan Note (Signed)
Discussed how this problem influences overall health and the risks it imposes  Reviewed plan for weight loss with lower calorie diet (via better food choices and also portion control or program like weight watchers) and exercise building up to or more than 30 minutes 5 days per week including some aerobic activity    

## 2020-02-10 NOTE — Patient Instructions (Signed)
Use ice on knees Try the knee sleeve on left just to see if it helps  xrays today  We will call with a result   Leave a urine sample on the way out    Labs look stable

## 2020-02-10 NOTE — Assessment & Plan Note (Signed)
Mostly anterior with fairly nl exam  xrays now-pend review  Disc use of knee sleeve on L that she had from prev injury  Ice prn

## 2020-02-10 NOTE — Assessment & Plan Note (Signed)
Disc goals for lipids and reasons to control them Rev last labs with pt Rev low sat fat diet in detail  Stable on atorvastatin

## 2020-02-10 NOTE — Assessment & Plan Note (Signed)
Disc in detail risks of smoking and possible outcomes including copd, vascular/ heart disease, cancer , respiratory and sinus infections  Pt voices understanding Pt does not want to quit at this time

## 2020-02-10 NOTE — Assessment & Plan Note (Signed)
bp in fair control at this time  BP Readings from Last 1 Encounters:  02/10/20 102/66   No changes needed Most recent labs reviewed  Disc lifstyle change with low sodium diet and exercise

## 2020-02-10 NOTE — Assessment & Plan Note (Signed)
With bladder tack in distant past  UA today shows concentrated urine  inst to inc fluids  May be candidate for estrogen vag cream

## 2020-02-10 NOTE — Telephone Encounter (Signed)
-----   Message from Maisie Fus, [place] sent at 02/10/2020  4:54 PM EDT ----- Results reviewed with patient, expressed understanding. Pt is open to trying the Estrogen cream and would like this sent to [facility].   Nothing further needed.

## 2020-02-10 NOTE — Progress Notes (Signed)
Subjective:    Patient ID: Kathleen Hernandez, female    DOB: 1955/08/27, 64 y.o.   MRN: [id number]  This visit occurred during the SARS-CoV-2 public health emergency.  Safety protocols were in place, including screening questions prior to the visit, additional usage of staff PPE, and extensive cleaning of exam room while observing appropriate contact time as indicated for disinfecting solutions.    HPI Pt presents for f/u of chronic medical problems along with multiple issues   Knee pain and stiffness Both of them hurt  L more than R  Anterior pain  They also swell a bit  Triggered by activity (like push mowing)  Steps also   Wants to check xrays    Incontinence -  Mixed- stress and urge  No pain to urinate  Bladder tack many years ago   ua today Results for orders placed or performed in visit on 02/10/20  Urinalysis Dipstick  Result Value Ref Range   Color, UA yellow    Clarity, UA clear    Glucose, UA Negative Negative   Bilirubin, UA neg    Ketones, UA neg    Spec Grav, UA >=1.030 (A) 1.010 - 1.025   Blood, UA neg    pH, UA 6.0 5.0 - 8.0   Protein, UA Negative Negative   Urobilinogen, UA 0.2 0.2 or 1.0 E.U./dL   Nitrite, UA neg    Leukocytes, UA Negative Negative   Appearance     Odor         Wt Readings from Last 3 Encounters:  02/10/20 227 lb 4.8 oz (103.1 kg)  08/05/19 224 lb (101.6 kg)  02/26/19 217 lb (98.4 kg)   36.69 kg/m   Smoking status  The same  No plans to quit   Fair diet  Not a lot of exercise   HTN-well controlled  bp is stable today  No cp or palpitations or headaches or edema  No side effects to medicines  BP Readings from Last 3 Encounters:  02/10/20 102/66  08/05/19 110/70  02/26/19 120/70     Pulse Readings from Last 3 Encounters:  02/10/20 (!) 43  08/05/19 (!) 56  02/26/19 60  does not get dizzy when she stands up    DM2 Lab Results  Component Value Date   HGBA1C 6.9 (H) 02/03/2020  this is stable since  December  No glycemic medication currently  Takes statin and arb   Diet- is normal for her / she does watch sugar and carbs  Foot care- no problems  Eye care-exam 2/20- she has it scheduled in July (had to change practices due to ins)  She has noticed some change in distance vision    Hyperlipidemia Lab Results  Component Value Date   CHOL 142 02/03/2020   CHOL 138 07/29/2019   CHOL 128 11/20/2018   Lab Results  Component Value Date   HDL 43.20 02/03/2020   HDL 42.90 07/29/2019   HDL 33.20 (L) 11/20/2018   Lab Results  Component Value Date   LDLCALC 76 02/03/2020   LDLCALC 75 07/29/2019   LDLCALC 75 11/20/2018   Lab Results  Component Value Date   TRIG 115.0 02/03/2020   TRIG 102.0 07/29/2019   TRIG 101.0 11/20/2018   Lab Results  Component Value Date   CHOLHDL 3 02/03/2020   CHOLHDL 3 07/29/2019   CHOLHDL 4 11/20/2018   No results found for: LDLDIRECT Takes atorvastatin 10 mg  LDL of 76 Fairly well controlled  Lab Results  Component Value Date   CREATININE 0.70 02/03/2020   BUN 17 02/03/2020   NA 139 02/03/2020   K 4.2 02/03/2020   CL 103 02/03/2020   CO2 32 02/03/2020    Patient Active Problem List   Diagnosis Date Noted  . Knee pain, bilateral 02/10/2020  . Mixed incontinence urge and stress 02/10/2020  . Routine general medical examination at a health care facility 08/05/2019  . Type 2 diabetes mellitus without complications (HCC) 08/23/2018  . Hyperlipidemia associated with type 2 diabetes mellitus (HCC) 08/31/2017  . Smoker 08/31/2017  . Hypertension 08/31/2017  . Encounter for screening mammogram for breast cancer 08/31/2017  . Obesity (BMI 30-39.9) 08/31/2017  . Depression with anxiety 08/31/2017  . Insomnia 08/31/2017   Past Medical History:  Diagnosis Date  . Depression   . Diabetes mellitus without complication (HCC)   . Family history of heart murmur   . History of chickenpox   . History of colon polyps   . History of  depression   . History of gastroesophageal reflux (GERD)   . History of hyperlipidemia   . History of urinary incontinence   . Hyperlipidemia   . Hypertension    Past Surgical History:  Procedure Laterality Date  . BREAST BIOPSY Right 2000   neg  . BREAST REDUCTION SURGERY  2010  . GALLBLADDER SURGERY  2009  . INCONTINENCE SURGERY  2009  . PARTIAL HYSTERECTOMY  2009  . REDUCTION MAMMAPLASTY Bilateral 2000   Social History   Tobacco Use  . Smoking status: Current Every Day Smoker    Packs/day: 1.00    Years: 41.00    Pack years: 41.00  . Smokeless tobacco: Never Used  Substance Use Topics  . Alcohol use: No  . Drug use: No   Family History  Problem Relation Age of Onset  . Arthritis Mother   . Heart attack Mother   . Early death Mother   . Alcohol abuse Father   . Alzheimer's disease Father   . Non-Hodgkin's lymphoma Sister   . Depression Sister   . Alcohol abuse Brother   . COPD Brother   . Heart attack Brother   . Hyperlipidemia Brother   . Diabetes Maternal Grandmother   . Alcohol abuse Maternal Grandfather   . Alzheimer's disease Paternal Grandmother   . Drug abuse Son    Allergies  Allergen Reactions  . Requip [Ropinirole Hcl] Other (See Comments)    Tongue swelling  . Chantix [Varenicline] Rash   Current Outpatient Medications on File Prior to Visit  Medication Sig Dispense Refill  . ACCU-CHEK FASTCLIX LANCETS MISC Check sugar once daily and as needed for diabetes. DX: E11.9 90 each 3  . aspirin 325 MG tablet Take 325 mg by mouth daily.    Marland Kitchen. atorvastatin (LIPITOR) 20 MG tablet Take 1 tablet (20 mg total) by mouth daily. 90 tablet 3  . Cholecalciferol (VITAMIN D3) 2000 units TABS Take 1 tablet by mouth daily.    Marland Kitchen. glucose blood (ACCU-CHEK GUIDE) test strip USE TO CHECK BLOOD SUGAR ONCE DAILY AND AS NEEDED FOR DM (DX. E11.9) 100 strip 1  . hydrochlorothiazide (HYDRODIURIL) 25 MG tablet Take 1 tablet (25 mg total) by mouth daily. 90 tablet 3  . losartan  (COZAAR) 100 MG tablet Take 1 tablet (100 mg total) by mouth daily. 90 tablet 3  . Omega-3 Fatty Acids (FISH OIL) 1200 MG CAPS Take 1 capsule by mouth daily.    Marland Kitchen. PARoxetine (PAXIL) 40  MG tablet Take 1 tablet (40 mg total) by mouth daily. 90 tablet 3  . vitamin E 400 UNIT capsule Take 400 Units by mouth daily.    Marland Kitchen zolpidem (AMBIEN) 10 MG tablet TAKE ONE TABLET BY MOUTH EVERY NIGHT AT BEDTIME AS NEEDED FOR SLEEP 30 tablet 3   No current facility-administered medications on file prior to visit.    Review of Systems  Constitutional: Negative for activity change, appetite change, fatigue, fever and unexpected weight change.  HENT: Negative for congestion, ear pain, rhinorrhea, sinus pressure and sore throat.   Eyes: Negative for pain, redness and visual disturbance.  Respiratory: Negative for cough, shortness of breath and wheezing.   Cardiovascular: Negative for chest pain and palpitations.  Gastrointestinal: Negative for abdominal pain, blood in stool, constipation and diarrhea.  Endocrine: Negative for polydipsia and polyuria.  Genitourinary: Positive for frequency and urgency. Negative for dysuria.  Musculoskeletal: Positive for arthralgias. Negative for back pain and myalgias.  Skin: Negative for pallor and rash.  Allergic/Immunologic: Negative for environmental allergies.  Neurological: Negative for dizziness, syncope and headaches.  Hematological: Negative for adenopathy. Does not bruise/bleed easily.  Psychiatric/Behavioral: Negative for decreased concentration and dysphoric mood. The patient is not nervous/anxious.        Objective:   Physical Exam Constitutional:      General: She is not in acute distress.    Appearance: Normal appearance. She is well-developed. She is obese. She is not ill-appearing or diaphoretic.  HENT:     Head: Normocephalic and atraumatic.  Eyes:     Conjunctiva/sclera: Conjunctivae normal.     Pupils: Pupils are equal, round, and reactive to light.   Neck:     Thyroid: No thyromegaly.     Vascular: No carotid bruit or JVD.  Cardiovascular:     Rate and Rhythm: Normal rate and regular rhythm.     Heart sounds: Normal heart sounds. No gallop.   Pulmonary:     Effort: Pulmonary effort is normal. No respiratory distress.     Breath sounds: Normal breath sounds. No wheezing or rales.  Abdominal:     General: Bowel sounds are normal. There is no distension or abdominal bruit.     Palpations: Abdomen is soft. There is no mass.     Tenderness: There is no abdominal tenderness. There is no right CVA tenderness or left CVA tenderness.     Comments: No suprapubic tenderness or fullness    Musculoskeletal:     Cervical back: Normal range of motion and neck supple.     Right knee: Normal.     Instability Tests: Anterior drawer test negative. Anterior Lachman test negative. Medial McMurray test negative and lateral McMurray test negative.     Left knee: Normal.     Instability Tests: Anterior drawer test negative. Anterior Lachman test negative. Medial McMurray test negative and lateral McMurray test negative.  Lymphadenopathy:     Cervical: No cervical adenopathy.  Skin:    General: Skin is warm and dry.     Findings: No rash.  Neurological:     Mental Status: She is alert.     Deep Tendon Reflexes: Reflexes are normal and symmetric.  Psychiatric:        Mood and Affect: Mood normal.           Assessment & Plan:   Problem List Items Addressed This Visit      Cardiovascular and Mediastinum   Hypertension    bp in fair control at this  time  BP Readings from Last 1 Encounters:  02/10/20 102/66   No changes needed Most recent labs reviewed  Disc lifstyle change with low sodium diet and exercise          Endocrine   Hyperlipidemia associated with type 2 diabetes mellitus (HCC)    Disc goals for lipids and reasons to control them Rev last labs with pt Rev low sat fat diet in detail  Stable on atorvastatin         Type 2 diabetes mellitus without complications (HCC)    Lab Results  Component Value Date   HGBA1C 6.9 (H) 02/03/2020   Stable without glycemic medication (she declines)  Diet is fair-counseled on this  Taking statin and arb  Eye exam scheduled in July  Nl foot exam F/u 6 mo         Other   Smoker    Disc in detail risks of smoking and possible outcomes including copd, vascular/ heart disease, cancer , respiratory and sinus infections  Pt voices understanding Pt does not want to quit at this time      Obesity (BMI 30-39.9)    Discussed how this problem influences overall health and the risks it imposes  Reviewed plan for weight loss with lower calorie diet (via better food choices and also portion control or program like weight watchers) and exercise building up to or more than 30 minutes 5 days per week including some aerobic activity         Knee pain, bilateral - Primary    Mostly anterior with fairly nl exam  xrays now-pend review  Disc use of knee sleeve on L that she had from prev injury  Ice prn        Relevant Orders   DG Knee 4 Views W/Patella Right   DG Knee 4 Views W/Patella Left   Mixed incontinence urge and stress    With bladder tack in distant past  UA today shows concentrated urine  inst to inc fluids  May be candidate for estrogen vag cream       Other Visit Diagnoses    Urinary incontinence, unspecified type       Relevant Orders   Urinalysis Dipstick (Completed)

## 2020-04-15 ENCOUNTER — Other Ambulatory Visit: Payer: Self-pay | Admitting: Family Medicine

## 2020-04-16 NOTE — Telephone Encounter (Signed)
Name of Medication:Ambien Name of Pharmacy:[facility] [address] Last [place] or Written Date and Quantity:08/29/19 #30 tabs with3refills Last Office Visit and Type:f/u on 02/10/20 Next Office Visit and Type:CPE on 08/09/20 Last Controlled Substance Agreement Date:no UDS Last UDS:no UDS

## 2020-07-20 ENCOUNTER — Telehealth: Payer: Self-pay

## 2020-07-20 NOTE — Telephone Encounter (Signed)
Pt said was at [facility] earlier today for cataract surgery and pt was advised that she was having skipping beats of her heart every other beat. I spoke with pt;this is a new symptom that was just found this morning prior to surgery; she said was told by [facility] personel it is not unusual to have PVC's. Pt has copy of EKG given to her from [facility]. No CP,SOB,H/A or dizziness. Pt has brother who had heart attack age 64. Pt has runny nose for 2 days. Pt scheduled virtual visit for runny nose 07/21/20 at 12 noon and will schedule visit for heart skipping after address runny nose.did not have covid test at [facility] prior to cataract surgery. UC & ED precautions given for CP, skipping heart, SOB,H/a or dizzines and pt voiced understanding.

## 2020-07-21 ENCOUNTER — Encounter: Payer: Self-pay | Admitting: Family Medicine

## 2020-07-21 ENCOUNTER — Telehealth (INDEPENDENT_AMBULATORY_CARE_PROVIDER_SITE_OTHER): Payer: PRIVATE HEALTH INSURANCE | Admitting: Family Medicine

## 2020-07-21 ENCOUNTER — Other Ambulatory Visit (INDEPENDENT_AMBULATORY_CARE_PROVIDER_SITE_OTHER): Payer: PRIVATE HEALTH INSURANCE

## 2020-07-21 ENCOUNTER — Other Ambulatory Visit: Payer: Self-pay | Admitting: Family Medicine

## 2020-07-21 VITALS — BP 118/54 | HR 79

## 2020-07-21 DIAGNOSIS — J3489 Other specified disorders of nose and nasal sinuses: Secondary | ICD-10-CM

## 2020-07-21 DIAGNOSIS — F17200 Nicotine dependence, unspecified, uncomplicated: Secondary | ICD-10-CM

## 2020-07-21 DIAGNOSIS — I493 Ventricular premature depolarization: Secondary | ICD-10-CM

## 2020-07-21 NOTE — Assessment & Plan Note (Signed)
Enc pt to quit She does not feel ready  Disc in detail risks of smoking and possible outcomes including copd, vascular/ heart disease, cancer , respiratory and sinus infections  Pt voices understanding

## 2020-07-21 NOTE — Assessment & Plan Note (Signed)
Per pt- these were seen on EKG during routine eye surgery yesterday  No symptoms Sounds like it may have been bigemeny  She was reassured by surgical staff  Will plan to have her in office for visit an re check EKG (once her runny nose is investigated)

## 2020-07-21 NOTE — Progress Notes (Signed)
Virtual Visit via Video Note  I connected with Kathleen Hernandez on 07/21/20 at 12:00 PM EST by a video enabled telemedicine application and verified that I am speaking with the correct person using two identifiers.  Location: Patient: home Provider: office   I discussed the limitations of evaluation and management by telemedicine and the availability of in person appointments. The patient expressed understanding and agreed to proceed.  Parties involved in encounter  Patient: Kathleen Hernandez  Provider:  Roxy Manns MD    History of Present Illness: Pt presents with runny nose/uri  She is diabetic and a smoker   Started a few days ago  Thinks it is from weather change (sits outside)   Nasal drainage is all clear  No sneezing  No itching in nasal passages  No pain in sinuses  No congestion   No ST  No ear pain  No loss of taste  No cough   Smoking is still the same   Has not been tested for covid   No fever or chills or aches   Otc: nothing   Of note pt had cataract surgery at [facility] yesterday and noted pvc on her EKG No symptoms A lot of eye problems - 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 Rhinorrhea 07/21/2020  . Knee pain, bilateral 02/10/2020  . Mixed incontinence urge and stress 02/10/2020  . Routine general medical examination at a health care facility 08/05/2019  . Type 2 diabetes mellitus without complications (HCC) 08/23/2018  . Hyperlipidemia associated with type 2 diabetes mellitus (HCC) 08/31/2017  . Smoker 08/31/2017  . Hypertension 08/31/2017  . Encounter for screening mammogram for breast cancer 08/31/2017  . Obesity (BMI 30-39.9) 08/31/2017  . Depression with anxiety 08/31/2017   . Insomnia 08/31/2017   Past Medical History:  Diagnosis Date  . Depression   . Diabetes mellitus without complication (HCC)   . Family history of heart murmur   . History of chickenpox   . History of colon polyps   . History of depression   . History of gastroesophageal reflux (GERD)   . History of hyperlipidemia   . History of urinary incontinence   . Hyperlipidemia   . Hypertension    Past Surgical History:  Procedure Laterality Date  . BREAST BIOPSY Right 2000   neg  . BREAST REDUCTION SURGERY  2010  . GALLBLADDER SURGERY  2009  . INCONTINENCE SURGERY  2009  . PARTIAL HYSTERECTOMY  2009  . REDUCTION MAMMAPLASTY Bilateral 2000   Social History   Tobacco Use  . Smoking status: Current Every Day Smoker    Packs/day: 1.00    Years: 41.00    Pack years: 41.00  . Smokeless tobacco: Never Used  Substance Use Topics  . Alcohol use: No  . Drug use: No   Family History  Problem Relation Age of Onset  . Arthritis Mother   . Heart attack Mother   . Early death Mother   . Alcohol abuse Father   . Alzheimer's disease Father   . Non-Hodgkin's lymphoma Sister   . Depression Sister   . Alcohol abuse Brother   . COPD Brother   .  Heart attack Brother   . Hyperlipidemia Brother   . Diabetes Maternal Grandmother   . Alcohol abuse Maternal Grandfather   . Alzheimer's disease Paternal Grandmother   . Drug abuse Son    Allergies  Allergen Reactions  . Requip [Ropinirole Hcl] Other (See Comments)    Tongue swelling  . Chantix [Varenicline] Rash   Current Outpatient Medications on File Prior to Visit  Medication Sig Dispense Refill  . ACCU-CHEK FASTCLIX LANCETS MISC Check sugar once daily and as needed for diabetes. DX: E11.9 90 each 3  . aspirin 325 MG tablet Take 325 mg by mouth daily.    Marland Kitchen atorvastatin (LIPITOR) 20 MG tablet Take 1 tablet (20 mg total) by mouth daily. 90 tablet 3  . Cholecalciferol (VITAMIN D3) 2000 units TABS Take 1 tablet by mouth daily.    Marland Kitchen  glucose blood (ACCU-CHEK GUIDE) test strip USE TO CHECK BLOOD SUGAR ONCE DAILY AND AS NEEDED FOR DM (DX. E11.9) 100 strip 1  . hydrochlorothiazide (HYDRODIURIL) 25 MG tablet Take 1 tablet (25 mg total) by mouth daily. 90 tablet 3  . losartan (COZAAR) 100 MG tablet Take 1 tablet (100 mg total) by mouth daily. 90 tablet 3  . Multiple Vitamins-Minerals (ZINC PO) Take 1 tablet by mouth daily.    . Omega-3 Fatty Acids (FISH OIL) 1200 MG CAPS Take 1 capsule by mouth daily.    Marland Kitchen PARoxetine (PAXIL) 40 MG tablet Take 1 tablet (40 mg total) by mouth daily. 90 tablet 3  . Turmeric 400 MG CAPS Take 1 tablet by mouth daily.    . vitamin E 400 UNIT capsule Take 400 Units by mouth daily.    Marland Kitchen zolpidem (AMBIEN) 10 MG tablet TAKE ONE TABLET BY MOUTH EVERY NIGHT AT BEDTIME AS NEEDED FOR SLEEP 30 tablet 3   No current facility-administered medications on file prior to visit.     Review of Systems  Constitutional: Negative for chills, fever and malaise/fatigue.  HENT: Negative for congestion, ear pain, sinus pain and sore throat.        Rhinorrhea  Eyes: Negative for blurred vision, discharge and redness.  Respiratory: Negative for cough, shortness of breath and stridor.   Cardiovascular: Negative for chest pain, palpitations, leg swelling and PND.  Gastrointestinal: Negative for abdominal pain, diarrhea, nausea and vomiting.  Musculoskeletal: Negative for myalgias.  Skin: Negative for rash.  Neurological: Negative for dizziness and headaches.    Observations/Objective: Patient appears well, in no distress Weight is baseline  No facial swelling or asymmetry Normal voice-not hoarse and no slurred speech No obvious tremor or mobility impairment Moving neck and UEs normally Able to hear the call well  No cough or shortness of breath during interview  Pt does sniffle occasionally  (breathing does not sound congested) Talkative and mentally sharp with no cognitive changes No skin changes on face or neck ,  no rash or pallor Affect is normal    Assessment and Plan: Problem List Items Addressed This Visit      Cardiovascular and Mediastinum   PVC (premature ventricular contraction)    Per pt- these were seen on EKG during routine eye surgery yesterday  No symptoms Sounds like it may have been bigemeny  She was reassured by surgical staff  Will plan to have her in office for visit an re check EKG (once her runny nose is investigated)        Other   Smoker    Enc pt to quit She does not  feel ready  Disc in detail risks of smoking and possible outcomes including copd, vascular/ heart disease, cancer , respiratory and sinus infections  Pt voices understanding       Rhinorrhea - Primary    By itself  Will watch for other uri symptoms incl fever or cough and update  covid test arranged this afternoon            Follow Up Instructions: The office will call you to set up covid swab here   You can try claritin 10 mg daily over the counter Store band is fine   If symptoms worsen or change let me know  If fever or cough develop let me know    I discussed the assessment and treatment plan with the patient. The patient was provided an opportunity to ask questions and all were answered. The patient agreed with the plan and demonstrated an understanding of the instructions.   The patient was advised to call back or seek an in-person evaluation if the symptoms worsen or if the condition fails to improve as anticipated.     Kathleen Manns, MD

## 2020-07-21 NOTE — Assessment & Plan Note (Signed)
By itself  Will watch for other uri symptoms incl fever or cough and update  covid test arranged this afternoon

## 2020-07-21 NOTE — Patient Instructions (Signed)
The office will call you to set up covid swab here   You can try claritin 10 mg daily over the counter Store band is fine   If symptoms worsen or change let me know  If fever or cough develop let me know

## 2020-07-22 ENCOUNTER — Other Ambulatory Visit (INDEPENDENT_AMBULATORY_CARE_PROVIDER_SITE_OTHER): Payer: PRIVATE HEALTH INSURANCE

## 2020-07-22 DIAGNOSIS — J3489 Other specified disorders of nose and nasal sinuses: Secondary | ICD-10-CM | POA: Diagnosis not present

## 2020-07-23 LAB — SPECIMEN STATUS REPORT

## 2020-07-23 LAB — SARS-COV-2, NAA 2 DAY TAT

## 2020-07-23 LAB — NOVEL CORONAVIRUS, NAA: SARS-CoV-2, NAA: NOT DETECTED

## 2020-08-01 ENCOUNTER — Telehealth: Payer: Self-pay | Admitting: Family Medicine

## 2020-08-01 DIAGNOSIS — E1169 Type 2 diabetes mellitus with other specified complication: Secondary | ICD-10-CM

## 2020-08-01 DIAGNOSIS — I10 Essential (primary) hypertension: Secondary | ICD-10-CM

## 2020-08-01 DIAGNOSIS — E119 Type 2 diabetes mellitus without complications: Secondary | ICD-10-CM

## 2020-08-01 DIAGNOSIS — E785 Hyperlipidemia, unspecified: Secondary | ICD-10-CM

## 2020-08-01 NOTE — Telephone Encounter (Signed)
-----   Message from Alvina Chou sent at 07/19/2020  4:08 PM EST ----- Regarding: Lab orders for Monday, 12.20.21 Patient is scheduled for CPX labs, please order future labs, Thanks , Camelia Eng

## 2020-08-02 ENCOUNTER — Other Ambulatory Visit: Payer: Self-pay

## 2020-08-02 ENCOUNTER — Other Ambulatory Visit (INDEPENDENT_AMBULATORY_CARE_PROVIDER_SITE_OTHER): Payer: PRIVATE HEALTH INSURANCE

## 2020-08-02 DIAGNOSIS — I10 Essential (primary) hypertension: Secondary | ICD-10-CM

## 2020-08-02 DIAGNOSIS — E1169 Type 2 diabetes mellitus with other specified complication: Secondary | ICD-10-CM

## 2020-08-02 DIAGNOSIS — E119 Type 2 diabetes mellitus without complications: Secondary | ICD-10-CM | POA: Diagnosis not present

## 2020-08-02 DIAGNOSIS — E785 Hyperlipidemia, unspecified: Secondary | ICD-10-CM

## 2020-08-02 LAB — CBC WITH DIFFERENTIAL/PLATELET
Basophils Absolute: 0 10*3/uL (ref 0.0–0.1)
Basophils Relative: 0.4 % (ref 0.0–3.0)
Eosinophils Absolute: 0.2 10*3/uL (ref 0.0–0.7)
Eosinophils Relative: 1.9 % (ref 0.0–5.0)
HCT: 46.4 % — ABNORMAL HIGH (ref 36.0–46.0)
Hemoglobin: 15.5 g/dL — ABNORMAL HIGH (ref 12.0–15.0)
Lymphocytes Relative: 25.5 % (ref 12.0–46.0)
Lymphs Abs: 3.1 10*3/uL (ref 0.7–4.0)
MCHC: 33.4 g/dL (ref 30.0–36.0)
MCV: 93.1 fl (ref 78.0–100.0)
Monocytes Absolute: 1.1 10*3/uL — ABNORMAL HIGH (ref 0.1–1.0)
Monocytes Relative: 8.9 % (ref 3.0–12.0)
Neutro Abs: 7.6 10*3/uL (ref 1.4–7.7)
Neutrophils Relative %: 63.3 % (ref 43.0–77.0)
Platelets: 315 10*3/uL (ref 150.0–400.0)
RBC: 4.99 Mil/uL (ref 3.87–5.11)
RDW: 12.4 % (ref 11.5–15.5)
WBC: 12 10*3/uL — ABNORMAL HIGH (ref 4.0–10.5)

## 2020-08-02 LAB — COMPREHENSIVE METABOLIC PANEL
ALT: 21 U/L (ref 0–35)
AST: 19 U/L (ref 0–37)
Albumin: 3.8 g/dL (ref 3.5–5.2)
Alkaline Phosphatase: 116 U/L (ref 39–117)
BUN: 16 mg/dL (ref 6–23)
CO2: 32 mEq/L (ref 19–32)
Calcium: 9.3 mg/dL (ref 8.4–10.5)
Chloride: 102 mEq/L (ref 96–112)
Creatinine, Ser: 0.73 mg/dL (ref 0.40–1.20)
GFR: 86.98 mL/min (ref >60.00)
Glucose, Bld: 142 mg/dL — ABNORMAL HIGH (ref 70–99)
Potassium: 4.5 mEq/L (ref 3.5–5.1)
Sodium: 140 mEq/L (ref 135–145)
Total Bilirubin: 0.6 mg/dL (ref 0.2–1.2)
Total Protein: 6.4 g/dL (ref 6.0–8.3)

## 2020-08-02 LAB — LIPID PANEL
Cholesterol: 159 mg/dL (ref 0–200)
HDL: 47.5 mg/dL (ref >39.00)
LDL Cholesterol: 82 mg/dL (ref 0–99)
NonHDL: 111.33
Total CHOL/HDL Ratio: 3
Triglycerides: 147 mg/dL (ref 0.0–149.0)
VLDL: 29.4 mg/dL (ref 0.0–40.0)

## 2020-08-02 LAB — TSH: TSH: 4.29 u[IU]/mL (ref 0.35–4.50)

## 2020-08-02 LAB — HEMOGLOBIN A1C: Hgb A1c MFr Bld: 7.2 % — ABNORMAL HIGH (ref 4.6–6.5)

## 2020-08-05 ENCOUNTER — Other Ambulatory Visit: Payer: Self-pay | Admitting: Family Medicine

## 2020-08-09 ENCOUNTER — Encounter: Payer: Self-pay | Admitting: Family Medicine

## 2020-08-09 ENCOUNTER — Ambulatory Visit (INDEPENDENT_AMBULATORY_CARE_PROVIDER_SITE_OTHER): Payer: PRIVATE HEALTH INSURANCE | Admitting: Family Medicine

## 2020-08-09 ENCOUNTER — Other Ambulatory Visit: Payer: Self-pay

## 2020-08-09 VITALS — BP 116/64 | HR 85 | Temp 96.9°F | Ht 66.0 in | Wt 231.0 lb

## 2020-08-09 DIAGNOSIS — I493 Ventricular premature depolarization: Secondary | ICD-10-CM

## 2020-08-09 DIAGNOSIS — I10 Essential (primary) hypertension: Secondary | ICD-10-CM

## 2020-08-09 DIAGNOSIS — E119 Type 2 diabetes mellitus without complications: Secondary | ICD-10-CM

## 2020-08-09 DIAGNOSIS — E1169 Type 2 diabetes mellitus with other specified complication: Secondary | ICD-10-CM

## 2020-08-09 DIAGNOSIS — F418 Other specified anxiety disorders: Secondary | ICD-10-CM

## 2020-08-09 DIAGNOSIS — F17200 Nicotine dependence, unspecified, uncomplicated: Secondary | ICD-10-CM

## 2020-08-09 DIAGNOSIS — E669 Obesity, unspecified: Secondary | ICD-10-CM | POA: Diagnosis not present

## 2020-08-09 DIAGNOSIS — Z0001 Encounter for general adult medical examination with abnormal findings: Secondary | ICD-10-CM | POA: Diagnosis not present

## 2020-08-09 DIAGNOSIS — E785 Hyperlipidemia, unspecified: Secondary | ICD-10-CM

## 2020-08-09 DIAGNOSIS — I499 Cardiac arrhythmia, unspecified: Secondary | ICD-10-CM | POA: Diagnosis not present

## 2020-08-09 DIAGNOSIS — D72829 Elevated white blood cell count, unspecified: Secondary | ICD-10-CM | POA: Insufficient documentation

## 2020-08-09 MED ORDER — LOSARTAN POTASSIUM 100 MG PO TABS: 100.0000 mg | ORAL_TABLET | Freq: Every day | ORAL | 3 refills | Status: DC

## 2020-08-09 MED ORDER — HYDROCHLOROTHIAZIDE 25 MG PO TABS: 25.0000 mg | ORAL_TABLET | Freq: Every day | ORAL | 3 refills | Status: DC

## 2020-08-09 MED ORDER — PAROXETINE HCL 40 MG PO TABS: 40.0000 mg | ORAL_TABLET | Freq: Every day | ORAL | 3 refills | Status: DC

## 2020-08-09 NOTE — Assessment & Plan Note (Signed)
Discussed how this problem influences overall health and the risks it imposes  Reviewed plan for weight loss with lower calorie diet (via better food choices and also portion control or program like weight watchers) and exercise building up to or more than 30 minutes 5 days per week including some aerobic activity   More motivation to exercise since a1c went up

## 2020-08-09 NOTE — Progress Notes (Signed)
Subjective:    Patient ID: Kathleen Hernandez, female    DOB: [birth year], 64 y.o.   MRN: [id number]  This visit occurred during the SARS-CoV-2 public health emergency.  Safety protocols were in place, including screening questions prior to the visit, additional usage of staff PPE, and extensive cleaning of exam room while observing appropriate contact time as indicated for disinfecting solutions.    HPI Here for health maintenance exam and to review chronic medical problems    Wt Readings from Last 3 Encounters:  08/09/20 231 lb (104.8 kg)  02/10/20 227 lb 4.8 oz (103.1 kg)  08/05/19 224 lb (101.6 kg)  wt is up a bit  Holiday eating  Not a lot of exercise overall  37.28 kg/m  Not motivated for exercise   Declines covid vaccine Declines flu vaccine  Tdap 2/19  Zoster status -not interested in shingrix  Has had shingles in the past   Pap 1/17 -nl at gyn per pt No h/o abd pap  No new partners or gyn symptoms   Had dexa with last gyn-normal No falls or fx  Takes vit D No exercise     Mammogram 2/19  Had breast reduction years ago- does not think much tissue left  Some tissue remains  She is interested in [employer]  Self breast exam -no lumps   Colonoscopy 11/14   Smoking status -1ppd  Not intent to quit  Not interested in lung cancer screening   No breathing problems   HTN bp is stable today  No cp or palpitations or headaches or edema  No side effects to medicines  BP Readings from Last 3 Encounters:  08/09/20 116/64  07/21/20 (!) 118/54  02/10/20 102/66    Taking hctz 25 mg daily Losartan 100 mg daily   Pulse Readings from Last 3 Encounters:  08/09/20 85  07/21/20 79  02/10/20 (!) 43   Had bigeminy on recent EKG from ophy (getting her cataracts taken care of)  No palpitations at all  Today EKG shows frequent PVC (not bigeminy)  Rate of 89 NSR ? Diffuse non specific T abn (noted flattened T waves V3 through v6)  Lab Results   Component Value Date   CREATININE 0.73 08/02/2020   BUN 16 08/02/2020   NA 140 08/02/2020   K 4.5 08/02/2020   CL 102 08/02/2020   CO2 32 08/02/2020   DM2 Lab Results  Component Value Date   HGBA1C 7.2 (H) 08/02/2020   This is up from 6.9  Taking arb and statin  No glycemic medications-declined in the past  Thinks she can change her diet  She has sweets once in a while (more in the past 3 months) No steroids  Eye exam - no retinopathy- has had exam (also cataracts)  Has narrow angles  Vision is still blurry (R one done last tues)-told it would gradually improve   Anxiety and depression  Takes paxil 40 mg daily  Also ambien for sleep   Cholesterol  Lab Results  Component Value Date   CHOL 159 08/02/2020   CHOL 142 02/03/2020   CHOL 138 07/29/2019   Lab Results  Component Value Date   HDL 47.50 08/02/2020   HDL 43.20 02/03/2020   HDL 42.90 07/29/2019   Lab Results  Component Value Date   LDLCALC 82 08/02/2020   LDLCALC 76 02/03/2020   LDLCALC 75 07/29/2019   Lab Results  Component Value Date   TRIG 147.0 08/02/2020   TRIG 115.0 02/03/2020  TRIG 102.0 07/29/2019   Lab Results  Component Value Date   CHOLHDL 3 08/02/2020   CHOLHDL 3 02/03/2020   CHOLHDL 3 07/29/2019   No results found for: LDLDIRECT Takes atorvastatin 20 mg daily   Other labs Lab Results  Component Value Date   WBC 12.0 (H) 08/02/2020   HGB 15.5 (H) 08/02/2020   HCT 46.4 (H) 08/02/2020   MCV 93.1 08/02/2020   PLT 315.0 08/02/2020    Lab Results  Component Value Date   TSH 4.29 08/02/2020   Lab Results  Component Value Date   ALT 21 08/02/2020   AST 19 08/02/2020   ALKPHOS 116 08/02/2020   BILITOT 0.6 08/02/2020    Patient Active Problem List   Diagnosis Date Noted  . Leukocytosis 08/09/2020  . Arrhythmia 08/09/2020  . Rhinorrhea 07/21/2020  . PVC (premature ventricular contraction) 07/21/2020  . Knee pain, bilateral 02/10/2020  . Mixed incontinence urge and  stress 02/10/2020  . Encounter for general adult medical examination with abnormal findings 08/05/2019  . Type 2 diabetes mellitus without complications (HCC) 08/23/2018  . Hyperlipidemia associated with type 2 diabetes mellitus (HCC) 08/31/2017  . Smoker 08/31/2017  . Hypertension 08/31/2017  . Encounter for screening mammogram for breast cancer 08/31/2017  . Obesity (BMI 30-39.9) 08/31/2017  . Depression with anxiety 08/31/2017  . Insomnia 08/31/2017   Past Medical History:  Diagnosis Date  . Depression   . Diabetes mellitus without complication (HCC)   . Family history of heart murmur   . History of chickenpox   . History of colon polyps   . History of depression   . History of gastroesophageal reflux (GERD)   . History of hyperlipidemia   . History of urinary incontinence   . Hyperlipidemia   . Hypertension    Past Surgical History:  Procedure Laterality Date  . BREAST BIOPSY Right 2000   neg  . BREAST REDUCTION SURGERY  2010  . GALLBLADDER SURGERY  2009  . INCONTINENCE SURGERY  2009  . PARTIAL HYSTERECTOMY  2009  . REDUCTION MAMMAPLASTY Bilateral 2000   Social History   Tobacco Use  . Smoking status: Current Every Day Smoker    Packs/day: 1.00    Years: 41.00    Pack years: 41.00  . Smokeless tobacco: Never Used  Substance Use Topics  . Alcohol use: No  . Drug use: No   Family History  Problem Relation Age of Onset  . Arthritis Mother   . Heart attack Mother   . Early death Mother   . Alcohol abuse Father   . Alzheimer's disease Father   . Non-Hodgkin's lymphoma Sister   . Depression Sister   . Alcohol abuse Brother   . COPD Brother   . Heart attack Brother   . Hyperlipidemia Brother   . Diabetes Maternal Grandmother   . Alcohol abuse Maternal Grandfather   . Alzheimer's disease Paternal Grandmother   . Drug abuse Son    Allergies  Allergen Reactions  . Requip [Ropinirole Hcl] Other (See Comments)    Tongue swelling  . Chantix [Varenicline]  Rash   Current Outpatient Medications on File Prior to Visit  Medication Sig Dispense Refill  . ACCU-CHEK FASTCLIX LANCETS MISC Check sugar once daily and as needed for diabetes. DX: E11.9 90 each 3  . aspirin 325 MG tablet Take 325 mg by mouth daily.    Marland Kitchen. atorvastatin (LIPITOR) 20 MG tablet TAKE 1 TABLET BY MOUTH EVERY DAY 90 tablet 1  . Cholecalciferol (  VITAMIN D3) 2000 units TABS Take 1 tablet by mouth daily.    Marland Kitchen glucose blood (ACCU-CHEK GUIDE) test strip USE TO CHECK BLOOD SUGAR ONCE DAILY AND AS NEEDED FOR DM (DX. E11.9) 100 strip 1  . Multiple Vitamins-Minerals (ZINC PO) Take 1 tablet by mouth daily.    . Omega-3 Fatty Acids (FISH OIL) 1200 MG CAPS Take 1 capsule by mouth daily.    . Turmeric 400 MG CAPS Take 1 tablet by mouth daily.    . vitamin E 400 UNIT capsule Take 400 Units by mouth daily.    Marland Kitchen zolpidem (AMBIEN) 10 MG tablet TAKE ONE TABLET BY MOUTH EVERY NIGHT AT BEDTIME AS NEEDED FOR SLEEP 30 tablet 3   No current facility-administered medications on file prior to visit.    Review of Systems  Constitutional: Negative for activity change, appetite change, fatigue, fever and unexpected weight change.  HENT: Negative for congestion, ear pain, rhinorrhea, sinus pressure and sore throat.   Eyes: Positive for visual disturbance. Negative for pain and redness.  Respiratory: Negative for cough, shortness of breath and wheezing.   Cardiovascular: Negative for chest pain and palpitations.  Gastrointestinal: Negative for abdominal pain, blood in stool, constipation and diarrhea.  Endocrine: Negative for polydipsia and polyuria.  Genitourinary: Negative for dysuria, frequency and urgency.  Musculoskeletal: Negative for arthralgias, back pain and myalgias.  Skin: Negative for pallor and rash.  Allergic/Immunologic: Negative for environmental allergies.  Neurological: Negative for dizziness, syncope and headaches.  Hematological: Negative for adenopathy. Does not bruise/bleed easily.   Psychiatric/Behavioral: Negative for decreased concentration and dysphoric mood. The patient is not nervous/anxious.        Objective:   Physical Exam Constitutional:      General: She is not in acute distress.    Appearance: Normal appearance. She is well-developed. She is obese. She is not ill-appearing or diaphoretic.  HENT:     Head: Normocephalic and atraumatic.     Right Ear: Tympanic membrane, ear canal and external ear normal.     Left Ear: Tympanic membrane, ear canal and external ear normal.     Nose: Nose normal. No congestion.     Mouth/Throat:     Mouth: Mucous membranes are moist.     Pharynx: Oropharynx is clear. No posterior oropharyngeal erythema.  Eyes:     General: No scleral icterus.    Extraocular Movements: Extraocular movements intact.     Conjunctiva/sclera: Conjunctivae normal.     Pupils: Pupils are equal, round, and reactive to light.  Neck:     Thyroid: No thyromegaly.     Vascular: No carotid bruit or JVD.  Cardiovascular:     Rate and Rhythm: Normal rate and regular rhythm.     Pulses: Normal pulses.     Heart sounds: Normal heart sounds. No gallop.   Pulmonary:     Effort: Pulmonary effort is normal. No respiratory distress.     Breath sounds: Normal breath sounds. No wheezing.     Comments: Good air exch Chest:     Chest wall: No tenderness.  Abdominal:     General: Bowel sounds are normal. There is no distension or abdominal bruit.     Palpations: Abdomen is soft. There is no mass.     Tenderness: There is no abdominal tenderness.     Hernia: No hernia is present.  Genitourinary:    Comments: Breast exam: No mass, nodules, thickening, tenderness, bulging, retraction, inflamation, nipple discharge or skin changes noted.  No axillary  or clavicular LA.     Scars from breast reduction noted  Musculoskeletal:        General: No tenderness. Normal range of motion.     Cervical back: Normal range of motion and neck supple. No rigidity. No  muscular tenderness.     Right lower leg: No edema.     Left lower leg: No edema.  Lymphadenopathy:     Cervical: No cervical adenopathy.  Skin:    General: Skin is warm and dry.     Coloration: Skin is not pale.     Findings: No erythema or rash.  Neurological:     Mental Status: She is alert. Mental status is at baseline.     Cranial Nerves: No cranial nerve deficit.     Motor: No abnormal muscle tone.     Coordination: Coordination normal.     Gait: Gait normal.     Deep Tendon Reflexes: Reflexes are normal and symmetric.  Psychiatric:        Mood and Affect: Mood normal.        Cognition and Memory: Cognition and memory normal.           Assessment & Plan:   Problem List Items Addressed This Visit      Cardiovascular and Mediastinum   Hypertension    bp in fair control at this time  BP Readings from Last 1 Encounters:  08/09/20 116/64   No changes needed Most recent labs reviewed  Disc lifstyle change with low sodium diet and exercise  Plan to continue hctz 25 mg daily and losartan 100 mg daily      Relevant Medications   losartan (COZAAR) 100 MG tablet   hydrochlorothiazide (HYDRODIURIL) 25 MG tablet   PVC (premature ventricular contraction)    EKG today shows 4 PVC during her 12 lead  T waves are flattened V3 through V6)  NSr rate of 89  Pt has no symptoms at all  Suspect her baseline  Enc her to avoid caffeine  Also adv smoking cessation  Will address further at 3 mo f/u      Relevant Medications   losartan (COZAAR) 100 MG tablet   hydrochlorothiazide (HYDRODIURIL) 25 MG tablet   Arrhythmia    PVC on EKG-not arrhythmia       Relevant Medications   losartan (COZAAR) 100 MG tablet   hydrochlorothiazide (HYDRODIURIL) 25 MG tablet   Other Relevant Orders   EKG 12-Lead (Completed)     Endocrine   Hyperlipidemia associated with type 2 diabetes mellitus (HCC)    Disc goals for lipids and reasons to control them Rev last labs with pt Rev low  sat fat diet in detail Stable control with atorvastatin 20 mg and good diet      Relevant Medications   losartan (COZAAR) 100 MG tablet   Type 2 diabetes mellitus without complications (HCC)    Lab Results  Component Value Date   HGBA1C 7.2 (H) 08/02/2020   A bit worse  Pt notes more sweets and can get back to better diet  Declines medication  On arb and statin  Sent for most recent eye exam  Nl foot exam today        Relevant Medications   losartan (COZAAR) 100 MG tablet     Other   Smoker    Disc in detail risks of smoking and possible outcomes including copd, vascular/ heart disease, cancer , respiratory and sinus infections  Pt voices understanding She is not  interested in quitting at this time Also declines lung cancer screening      Obesity (BMI 30-39.9)    Discussed how this problem influences overall health and the risks it imposes  Reviewed plan for weight loss with lower calorie diet (via better food choices and also portion control or program like weight watchers) and exercise building up to or more than 30 minutes 5 days per week including some aerobic activity   More motivation to exercise since a1c went up      Depression with anxiety    Fairly stable as long as she takes paxil  Also ambien for sleep (with caution)  Reviewed stressors/ coping techniques/symptoms/ support sources/ tx options and side effects in detail today       Relevant Medications   PARoxetine (PAXIL) 40 MG tablet   Encounter for general adult medical examination with abnormal findings - Primary    Reviewed health habits including diet and exercise and skin cancer prevention Reviewed appropriate screening tests for age  Also reviewed health mt list, fam hx and immunization status , as well as social and family history   See HPI Labs reviewed  Pt declines covid and flu vaccines  Declines shigrix vaccine  Pap 2017  Needs mammogram -she plans to call to schedule  Not interested in  smoking cessation  Mood is stable      Leukocytosis    Mild Suspect this and Hb elevation are from smoking Discussed this She is not interested in quitting

## 2020-08-09 NOTE — Patient Instructions (Addendum)
Try to get most of your carbohydrates from produce (with the exception of white potatoes)  Eat less bread/pasta/rice/snack foods/cereals/sweets and other items from the middle of the grocery store (processed carbs)  Some more exercise would help also   Please schedule your mammogram at the [facility] breast center

## 2020-08-09 NOTE — Assessment & Plan Note (Signed)
Disc goals for lipids and reasons to control them Rev last labs with pt Rev low sat fat diet in detail Stable control with atorvastatin 20 mg and good diet

## 2020-08-09 NOTE — Assessment & Plan Note (Signed)
Disc in detail risks of smoking and possible outcomes including copd, vascular/ heart disease, cancer , respiratory and sinus infections  Pt voices understanding She is not interested in quitting at this time Also declines lung cancer screening

## 2020-08-09 NOTE — Assessment & Plan Note (Signed)
PVC on EKG-not arrhythmia

## 2020-08-09 NOTE — Assessment & Plan Note (Signed)
Lab Results  Component Value Date   HGBA1C 7.2 (H) 08/02/2020   A bit worse  Pt notes more sweets and can get back to better diet  Declines medication  On arb and statin  Sent for most recent eye exam  Nl foot exam today

## 2020-08-09 NOTE — Assessment & Plan Note (Signed)
Reviewed health habits including diet and exercise and skin cancer prevention Reviewed appropriate screening tests for age  Also reviewed health mt list, fam hx and immunization status , as well as social and family history   See HPI Labs reviewed  Pt declines covid and flu vaccines  Declines shigrix vaccine  Pap 2017  Needs mammogram -she plans to call to schedule  Not interested in smoking cessation  Mood is stable

## 2020-08-09 NOTE — Assessment & Plan Note (Signed)
bp in fair control at this time  BP Readings from Last 1 Encounters:  08/09/20 116/64   No changes needed Most recent labs reviewed  Disc lifstyle change with low sodium diet and exercise  Plan to continue hctz 25 mg daily and losartan 100 mg daily

## 2020-08-09 NOTE — Assessment & Plan Note (Addendum)
EKG today shows 4 PVC during her 12 lead  T waves are flattened V3 through V6)  NSr rate of 89  Pt has no symptoms at all  Suspect her baseline  Enc her to avoid caffeine  Also adv smoking cessation  Will address further at 3 mo f/u

## 2020-08-09 NOTE — Assessment & Plan Note (Signed)
Fairly stable as long as she takes paxil  Also ambien for sleep (with caution)  Reviewed stressors/ coping techniques/symptoms/ support sources/ tx options and side effects in detail today

## 2020-08-09 NOTE — Assessment & Plan Note (Signed)
Mild Suspect this and Hb elevation are from smoking Discussed this She is not interested in quitting

## 2020-10-05 ENCOUNTER — Other Ambulatory Visit: Payer: Self-pay | Admitting: Family Medicine

## 2020-10-05 DIAGNOSIS — Z1231 Encounter for screening mammogram for malignant neoplasm of breast: Secondary | ICD-10-CM

## 2020-10-09 NOTE — Telephone Encounter (Signed)
Pt has had several apts with PCP since this encounter.

## 2020-10-18 ENCOUNTER — Other Ambulatory Visit: Payer: Self-pay

## 2020-10-18 ENCOUNTER — Ambulatory Visit
Admission: RE | Admit: 2020-10-18 | Discharge: 2020-10-18 | Disposition: A | Discharge status: Home / Self Care | Payer: 59 | Source: Ambulatory Visit | Attending: Family Medicine | Admitting: Family Medicine

## 2020-10-18 DIAGNOSIS — Z1231 Encounter for screening mammogram for malignant neoplasm of breast: Secondary | ICD-10-CM | POA: Insufficient documentation

## 2020-10-26 ENCOUNTER — Other Ambulatory Visit: Payer: Self-pay | Admitting: Family Medicine

## 2020-10-26 NOTE — Telephone Encounter (Signed)
Name of Medication:Ambien Name of Pharmacy:[facility] [address] Last [place] or Written Date and Quantity:04/16/20/21 #30 tabs with3refills Last Office Visit and Type:CPE on 08/09/20 Next Office Visit and Type:f/u on 3/ Last Controlled Substance Agreement Date:no UDS Last UDS:no UDS

## 2020-11-08 ENCOUNTER — Ambulatory Visit: Payer: PRIVATE HEALTH INSURANCE | Admitting: Family Medicine

## 2020-11-08 LAB — HM DIABETES EYE EXAM

## 2020-11-11 ENCOUNTER — Other Ambulatory Visit: Payer: Self-pay | Admitting: Family Medicine

## 2020-11-17 ENCOUNTER — Ambulatory Visit: Payer: 59 | Admitting: Family Medicine

## 2020-11-24 ENCOUNTER — Ambulatory Visit (INDEPENDENT_AMBULATORY_CARE_PROVIDER_SITE_OTHER): Payer: 59 | Admitting: Family Medicine

## 2020-11-24 ENCOUNTER — Other Ambulatory Visit: Payer: Self-pay

## 2020-11-24 ENCOUNTER — Encounter: Payer: Self-pay | Admitting: Family Medicine

## 2020-11-24 VITALS — BP 110/62 | HR 69 | Temp 97.1°F | Ht 66.0 in | Wt 228.0 lb

## 2020-11-24 DIAGNOSIS — I10 Essential (primary) hypertension: Secondary | ICD-10-CM

## 2020-11-24 DIAGNOSIS — E669 Obesity, unspecified: Secondary | ICD-10-CM

## 2020-11-24 DIAGNOSIS — E785 Hyperlipidemia, unspecified: Secondary | ICD-10-CM

## 2020-11-24 DIAGNOSIS — E1169 Type 2 diabetes mellitus with other specified complication: Secondary | ICD-10-CM

## 2020-11-24 DIAGNOSIS — H18529 Epithelial (juvenile) corneal dystrophy, unspecified eye: Secondary | ICD-10-CM

## 2020-11-24 DIAGNOSIS — E119 Type 2 diabetes mellitus without complications: Secondary | ICD-10-CM

## 2020-11-24 DIAGNOSIS — F17200 Nicotine dependence, unspecified, uncomplicated: Secondary | ICD-10-CM

## 2020-11-24 LAB — POCT GLYCOSYLATED HEMOGLOBIN (HGB A1C): Hemoglobin A1C: 7 % — AB (ref 4.0–5.6)

## 2020-11-24 MED ORDER — METFORMIN HCL ER 500 MG PO TB24: 500.0000 mg | ORAL_TABLET | Freq: Every day | ORAL | 3 refills | Status: DC

## 2020-11-24 NOTE — Assessment & Plan Note (Signed)
Under care of ophty at [facility] Much discomfort and photophobia  Also dry eye syndrome

## 2020-11-24 NOTE — Assessment & Plan Note (Signed)
Disc goals for lipids and reasons to control them Rev last labs with pt Rev low sat fat diet in detail Plan to continue atorvastatin 20 mg daily

## 2020-11-24 NOTE — Assessment & Plan Note (Signed)
Mild improvement with dietary change  Lab Results  Component Value Date   HGBA1C 7.0 (A) 11/24/2020   Will add metformin xr 500 mg daily  Pt has done DM teaching  On arb and statin  [facility]d eye care  Plan f/u in 3 mo

## 2020-11-24 NOTE — Assessment & Plan Note (Signed)
Discussed how this problem influences overall health and the risks it imposes  Reviewed plan for weight loss with lower calorie diet (via better food choices and also portion control or program like weight watchers) and exercise building up to or more than 30 minutes 5 days per week including some aerobic activity    

## 2020-11-24 NOTE — Patient Instructions (Addendum)
For diabetes  Start metformin xr 500 mg once daily in the am   Try to get most of your carbohydrates from produce (with the exception of white potatoes)  Eat less bread/pasta/rice/snack foods/cereals/sweets and other items from the middle of the grocery store (processed carbs)  Start some exercise when you feel up to it   Follow up in 3 months with labs prior

## 2020-11-24 NOTE — Assessment & Plan Note (Signed)
Disc in detail risks of smoking and possible outcomes including copd, vascular/ heart disease, cancer , respiratory and sinus infections  Pt voices understanding Pt states she is not ready to quit at this time

## 2020-11-24 NOTE — Progress Notes (Signed)
Subjective:    Patient ID: Kathleen Hernandez, female    DOB: September 09, 1955, 65 y.o.   MRN: [id number]  This visit occurred during the SARS-CoV-2 public health emergency.  Safety protocols were in place, including screening questions prior to the visit, additional usage of staff PPE, and extensive cleaning of exam room while observing appropriate contact time as indicated for disinfecting solutions.    HPI Pt presents for f/u of chronic health problems  Wt Readings from Last 3 Encounters:  11/24/20 228 lb (103.4 kg)  08/09/20 231 lb (104.8 kg)  02/10/20 227 lb 4.8 oz (103.1 kg)   36.80 kg/m   Has been going back and forth to [facility] for eye problems  Blurriness is better but still very light sensitive  xidra 5% - taking some time to work Has f/u may 2    HTN bp is stable today  No cp or palpitations or headaches or edema  No side effects to medicines  BP Readings from Last 3 Encounters:  11/24/20 110/62  08/09/20 116/64  07/21/20 (!) 118/54     Taking hctz 25 mg daily  Losartan 100 mg daily   Lab Results  Component Value Date   CREATININE 0.73 08/02/2020   BUN 16 08/02/2020   NA 140 08/02/2020   K 4.5 08/02/2020   CL 102 08/02/2020   CO2 32 08/02/2020    DM2 Lab Results  Component Value Date   HGBA1C 7.2 (H) 08/02/2020   Discussed last visit Declined medication  Was eating a lot more sweets at that time   Has made a little bit of change  More mindful of sweets and eating less (not none)  Drinks sugar free and caffeine free  Also eating less processed carbs   Not a lot of exercise  Plans to pick up   Lab Results  Component Value Date   HGBA1C 7.0 (A) 11/24/2020     Eye exam 2 y ago  Takes ARB and statin   Hyperlipidemia Lab Results  Component Value Date   CHOL 159 08/02/2020   HDL 47.50 08/02/2020   LDLCALC 82 08/02/2020   TRIG 147.0 08/02/2020   CHOLHDL 3 08/02/2020  takes atorvastatin 20 mg daily   No change in smoking status  Not  interested in quitting at this time  Patient Active Problem List   Diagnosis Date Noted  . 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 Leukocytosis 08/09/2020  . Arrhythmia 08/09/2020  . Rhinorrhea 07/21/2020  . PVC (premature ventricular contraction) 07/21/2020  . Knee pain, bilateral 02/10/2020  . Mixed incontinence urge and stress 02/10/2020  . Encounter for general adult medical examination with abnormal findings 08/05/2019  . Type 2 diabetes mellitus without complications (HCC) 08/23/2018  . Hyperlipidemia associated with type 2 diabetes mellitus (HCC) 08/31/2017  . Smoker 08/31/2017  . Hypertension 08/31/2017  . Encounter for screening mammogram for breast cancer 08/31/2017  . Obesity (BMI 30-39.9) 08/31/2017  . Depression with anxiety 08/31/2017  . Insomnia 08/31/2017   Past Medical History:  Diagnosis Date  . Depression   . Diabetes mellitus without complication (HCC)   . Family history of heart murmur   . History of chickenpox   . History of colon polyps   . History of depression   . History of gastroesophageal reflux (GERD)   . History of hyperlipidemia   . History of urinary incontinence   . Hyperlipidemia   . Hypertension    Past Surgical History:  Procedure Laterality Date  .  BREAST BIOPSY Right 2000   neg  . BREAST REDUCTION SURGERY  2010  . GALLBLADDER SURGERY  2009  . INCONTINENCE SURGERY  2009  . PARTIAL HYSTERECTOMY  2009  . REDUCTION MAMMAPLASTY Bilateral 2000   Social History   Tobacco Use  . Smoking status: Current Every Day Smoker    Packs/day: 1.00    Years: 41.00    Pack years: 41.00  . Smokeless tobacco: Never Used  Substance Use Topics  . Alcohol use: No  . Drug use: No   Family History  Problem Relation Age of Onset  . Arthritis Mother   . Heart attack Mother   . Early death Mother   . Alcohol abuse Father   . Alzheimer's disease Father   . Non-Hodgkin's lymphoma Sister   . Depression Sister   . Alcohol abuse  Brother   . COPD Brother   . Heart attack Brother   . Hyperlipidemia Brother   . Diabetes Maternal Grandmother   . Alcohol abuse Maternal Grandfather   . Alzheimer's disease Paternal Grandmother   . Drug abuse Son    Allergies  Allergen Reactions  . Requip [Ropinirole Hcl] Other (See Comments)    Tongue swelling  . Chantix [Varenicline] Rash   Current Outpatient Medications on File Prior to Visit  Medication Sig Dispense Refill  . ACCU-CHEK FASTCLIX LANCETS MISC Check sugar once daily and as needed for diabetes. DX: E11.9 90 each 3  . aspirin 325 MG tablet Take 325 mg by mouth daily.    Marland Kitchen atorvastatin (LIPITOR) 20 MG tablet TAKE 1 TABLET BY MOUTH EVERY DAY 90 tablet 1  . Cholecalciferol (VITAMIN D3) 2000 units TABS Take 1 tablet by mouth daily.    Marland Kitchen glucose blood (ACCU-CHEK GUIDE) test strip USE TO CHECK BLOOD SUGAR ONCE DAILY AND AS NEEDED FOR DM (DX. E11.9) 100 strip 1  . hydrochlorothiazide (HYDRODIURIL) 25 MG tablet Take 1 tablet (25 mg total) by mouth daily. 90 tablet 3  . Lifitegrast (XIIDRA) 5 % SOLN Apply to eye.    . losartan (COZAAR) 100 MG tablet Take 1 tablet (100 mg total) by mouth daily. 90 tablet 3  . Multiple Vitamins-Minerals (ZINC PO) Take 1 tablet by mouth daily.    . Omega-3 Fatty Acids (FISH OIL) 1200 MG CAPS Take 1 capsule by mouth daily.    Marland Kitchen PARoxetine (PAXIL) 40 MG tablet Take 1 tablet (40 mg total) by mouth daily. 90 tablet 3  . Turmeric 400 MG CAPS Take 1 tablet by mouth daily.    . vitamin E 400 UNIT capsule Take 400 Units by mouth daily.    Marland Kitchen zolpidem (AMBIEN) 10 MG tablet TAKE ONE TABLET BY MOUTH EVERY NIGHT AT BEDTIME AS NEEDED FOR SLEEP 30 tablet 3   No current facility-administered medications on file prior to visit.    Review of Systems  Constitutional: Negative for activity change, appetite change, fatigue, fever and unexpected weight change.  HENT: Negative for congestion, ear pain, rhinorrhea, sinus pressure and sore throat.   Eyes: Positive  for photophobia. Negative for pain, redness and visual disturbance.  Respiratory: Negative for cough, shortness of breath and wheezing.   Cardiovascular: Negative for chest pain and palpitations.  Gastrointestinal: Negative for abdominal pain, blood in stool, constipation and diarrhea.  Endocrine: Negative for polydipsia and polyuria.  Genitourinary: Negative for dysuria, frequency and urgency.  Musculoskeletal: Negative for arthralgias, back pain and myalgias.  Skin: Negative for pallor and rash.  Allergic/Immunologic: Negative for environmental allergies.  Neurological: Negative for dizziness, syncope and headaches.  Hematological: Negative for adenopathy. Does not bruise/bleed easily.  Psychiatric/Behavioral: Negative for decreased concentration and dysphoric mood. The patient is not nervous/anxious.        Objective:   Physical Exam Constitutional:      General: She is not in acute distress.    Appearance: Normal appearance. She is well-developed. She is obese. She is not ill-appearing.  HENT:     Head: Normocephalic and atraumatic.  Eyes:     Conjunctiva/sclera: Conjunctivae normal.     Pupils: Pupils are equal, round, and reactive to light.  Neck:     Thyroid: No thyromegaly.     Vascular: No carotid bruit or JVD.  Cardiovascular:     Rate and Rhythm: Normal rate and regular rhythm.     Heart sounds: Normal heart sounds. No gallop.   Pulmonary:     Effort: Pulmonary effort is normal. No respiratory distress.     Breath sounds: Normal breath sounds. No wheezing or rales.  Abdominal:     General: Bowel sounds are normal. There is no distension or abdominal bruit.     Palpations: Abdomen is soft.  Musculoskeletal:     Cervical back: Normal range of motion and neck supple.     Right lower leg: No edema.     Left lower leg: No edema.  Lymphadenopathy:     Cervical: No cervical adenopathy.  Skin:    General: Skin is warm and dry.     Findings: No rash.  Neurological:      Mental Status: She is alert.     Sensory: No sensory deficit.     Deep Tendon Reflexes: Reflexes are normal and symmetric.  Psychiatric:        Mood and Affect: Mood normal.           Assessment & Plan:   Problem List Items Addressed This Visit      Cardiovascular and Mediastinum   Hypertension    bp in fair control at this time  BP Readings from Last 1 Encounters:  11/24/20 110/62   No changes needed Most recent labs reviewed  Disc lifstyle change with low sodium diet and exercise  Plan to continue hctz 25 mg daily  Losartan 100 mg daily        Endocrine   Hyperlipidemia associated with type 2 diabetes mellitus (HCC)    Disc goals for lipids and reasons to control them Rev last labs with pt Rev low sat fat diet in detail Plan to continue atorvastatin 20 mg daily       Relevant Medications   metFORMIN (GLUCOPHAGE XR) 500 MG 24 hr tablet   Type 2 diabetes mellitus without complications (HCC) - Primary    Mild improvement with dietary change  Lab Results  Component Value Date   HGBA1C 7.0 (A) 11/24/2020   Will add metformin xr 500 mg daily  Pt has done DM teaching  On arb and statin  utd eye care  Plan f/u in 3 mo      Relevant Medications   metFORMIN (GLUCOPHAGE XR) 500 MG 24 hr tablet   Other Relevant Orders   POCT glycosylated hemoglobin (Hb A1C) (Completed)     Other   Smoker    Disc in detail risks of smoking and possible outcomes including copd, vascular/ heart disease, cancer , respiratory and sinus infections  Pt voices understanding Pt states she is not ready to quit at this time  Obesity (BMI 30-39.9)    Discussed how this problem influences overall health and the risks it imposes  Reviewed plan for weight loss with lower calorie diet (via better food choices and also portion control or program like weight watchers) and exercise building up to or more than 30 minutes 5 days per week including some aerobic activity         Relevant  Medications   metFORMIN (GLUCOPHAGE XR) 500 MG 24 hr tablet   ABMD (anterior basement membrane dystrophy)    Under care of [facility] at [facility] Much discomfort and photophobia  Also dry eye syndrome

## 2020-11-24 NOTE — Assessment & Plan Note (Signed)
bp in fair control at this time  BP Readings from Last 1 Encounters:  11/24/20 110/62   No changes needed Most recent labs reviewed  Disc lifstyle change with low sodium diet and exercise  Plan to continue hctz 25 mg daily  Losartan 100 mg daily

## 2021-02-02 ENCOUNTER — Emergency Department
Admission: EM | Admit: 2021-02-02 | Discharge: 2021-02-03 | Disposition: A | Discharge status: Home / Self Care | Payer: 59 | Attending: Emergency Medicine | Admitting: Emergency Medicine

## 2021-02-02 ENCOUNTER — Other Ambulatory Visit: Payer: Self-pay

## 2021-02-02 ENCOUNTER — Encounter: Payer: Self-pay | Admitting: *Deleted

## 2021-02-02 DIAGNOSIS — R0789 Other chest pain: Secondary | ICD-10-CM | POA: Diagnosis not present

## 2021-02-02 DIAGNOSIS — T782XXA Anaphylactic shock, unspecified, initial encounter: Secondary | ICD-10-CM | POA: Insufficient documentation

## 2021-02-02 DIAGNOSIS — Z79899 Other long term (current) drug therapy: Secondary | ICD-10-CM | POA: Insufficient documentation

## 2021-02-02 DIAGNOSIS — Z7984 Long term (current) use of oral hypoglycemic drugs: Secondary | ICD-10-CM | POA: Diagnosis not present

## 2021-02-02 DIAGNOSIS — Z7982 Long term (current) use of aspirin: Secondary | ICD-10-CM | POA: Insufficient documentation

## 2021-02-02 DIAGNOSIS — I10 Essential (primary) hypertension: Secondary | ICD-10-CM | POA: Insufficient documentation

## 2021-02-02 DIAGNOSIS — E119 Type 2 diabetes mellitus without complications: Secondary | ICD-10-CM | POA: Insufficient documentation

## 2021-02-02 DIAGNOSIS — X58XXXA Exposure to other specified factors, initial encounter: Secondary | ICD-10-CM | POA: Diagnosis not present

## 2021-02-02 DIAGNOSIS — F17210 Nicotine dependence, cigarettes, uncomplicated: Secondary | ICD-10-CM | POA: Diagnosis not present

## 2021-02-02 MED ORDER — FAMOTIDINE IN NACL 20-0.9 MG/50ML-% IV SOLN
20.0000 mg | Freq: Once | INTRAVENOUS | Status: AC
  Administered 2021-02-02: 20 mg via INTRAVENOUS
  Filled 2021-02-02: qty 50

## 2021-02-02 MED ORDER — EPINEPHRINE 0.3 MG/0.3ML IJ SOAJ
INTRAMUSCULAR | Status: AC
  Administered 2021-02-02: 0.3 mg via INTRAMUSCULAR
  Filled 2021-02-02: qty 0.3

## 2021-02-02 MED ORDER — METHYLPREDNISOLONE SODIUM SUCC 125 MG IJ SOLR
125.0000 mg | Freq: Once | INTRAMUSCULAR | Status: AC
  Administered 2021-02-02: 125 mg via INTRAVENOUS
  Filled 2021-02-02: qty 2

## 2021-02-02 MED ORDER — EPINEPHRINE 0.3 MG/0.3ML IJ SOAJ
0.3000 mg | Freq: Once | INTRAMUSCULAR | Status: AC
  Filled 2021-02-02: qty 0.3

## 2021-02-02 NOTE — ED Notes (Signed)
ED Provider at bedside. 

## 2021-02-02 NOTE — ED Provider Notes (Signed)
[facility] Emergency Department Provider Note ____________________________________________   Event Date/Time   First MD Initiated Contact with Patient 02/02/21 2310     (approximate)  I have reviewed the triage vital signs and the nursing notes.   HISTORY  Chief Complaint Allergic Reaction    HPI Kathleen Hernandez is a 65 y.o. female with PMH as noted below including hypertension, hyperlipidemia and DM who presents with an apparent allergic reaction, cute onset within the last hour.  The patient states that she was getting ready for bed and went to brush her teeth.  She then started to feel itching in her body and chest.  She took a Benadryl with no relief.  She then took a second Benadryl and felt like her throat was closing and she could not swallow.  She reports some associated shortness of breath.  She states that now she is having some chest discomfort.  The patient reports a known allergy to Requip and Chantix, however she has not had a reaction like this before.  She is not sure which she could have been exposed to but states that the toothpaste she was using was brand-new and just opened.  Past Medical History:  Diagnosis Date   Depression    Diabetes mellitus without complication (HCC)    Family history of heart murmur    History of chickenpox    History of colon polyps    History of depression    History of gastroesophageal reflux (GERD)    History of hyperlipidemia    History of urinary incontinence    Hyperlipidemia    Hypertension     Patient Active Problem List   Diagnosis Date Noted   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   Leukocytosis 08/09/2020   Arrhythmia 08/09/2020   Rhinorrhea 07/21/2020   PVC (premature ventricular contraction) 07/21/2020   Knee pain, bilateral 02/10/2020   Mixed incontinence urge and stress 02/10/2020   Encounter for general adult medical examination with abnormal findings 08/05/2019    Type 2 diabetes mellitus without complications (HCC) 08/23/2018   Hyperlipidemia associated with type 2 diabetes mellitus (HCC) 08/31/2017   Smoker 08/31/2017   Hypertension 08/31/2017   Encounter for screening mammogram for breast cancer 08/31/2017   Obesity (BMI 30-39.9) 08/31/2017   Depression with anxiety 08/31/2017   Insomnia 08/31/2017    Past Surgical History:  Procedure Laterality Date   BREAST BIOPSY Right 2000   neg   BREAST REDUCTION SURGERY  2010   GALLBLADDER SURGERY  2009   INCONTINENCE SURGERY  2009   PARTIAL HYSTERECTOMY  2009   REDUCTION MAMMAPLASTY Bilateral 2000    Prior to Admission medications   Medication Sig Start Date End Date Taking? Authorizing Provider  EPINEPHrine (EPIPEN 2-PAK) 0.3 mg/0.3 mL IJ SOAJ injection Inject 0.3 mg into the muscle as needed for anaphylaxis. 02/03/21  Yes Dionne Bucy, MD  ACCU-CHEK FASTCLIX LANCETS MISC Check sugar once daily and as needed for diabetes. DX: E11.9 09/13/18   Tower, Audrie Gallus, MD  aspirin 325 MG tablet Take 325 mg by mouth daily.    [provider]  atorvastatin (LIPITOR) 20 MG tablet TAKE 1 TABLET BY MOUTH EVERY DAY 11/11/20   Tower, Audrie Gallus, MD  Cholecalciferol (VITAMIN D3) 2000 units TABS Take 1 tablet by mouth daily.    [provider]  glucose blood (ACCU-CHEK GUIDE) test strip USE TO CHECK BLOOD SUGAR ONCE DAILY AND AS NEEDED FOR DM (DX. E11.9) 09/22/19   Tower, [place]  A, MD  hydrochlorothiazide (HYDRODIURIL) 25 MG tablet Take 1 tablet (25 mg total) by mouth daily. 08/09/20   Tower, Audrie Gallus, MD  Lifitegrast Benay Spice) 5 % SOLN Apply to eye. 10/11/20   [provider]  losartan (COZAAR) 100 MG tablet Take 1 tablet (100 mg total) by mouth daily. 08/09/20   Tower, Audrie Gallus, MD  metFORMIN (GLUCOPHAGE XR) 500 MG 24 hr tablet Take 1 tablet (500 mg total) by mouth daily with breakfast. 11/24/20   Tower, Audrie Gallus, MD  Multiple Vitamins-Minerals (ZINC PO) Take 1 tablet by mouth daily.    [provider]  Omega-3 Fatty Acids (FISH OIL) 1200 MG CAPS Take 1 capsule by mouth daily.    [provider]  PARoxetine (PAXIL) 40 MG tablet Take 1 tablet (40 mg total) by mouth daily. 08/09/20   Tower, Audrie Gallus, MD  Turmeric 400 MG CAPS Take 1 tablet by mouth daily.    [provider]  vitamin E 400 UNIT capsule Take 400 Units by mouth daily.    [provider]  zolpidem (AMBIEN) 10 MG tablet TAKE ONE TABLET BY MOUTH EVERY NIGHT AT BEDTIME AS NEEDED FOR SLEEP 10/26/20   Tower, Audrie Gallus, MD    Allergies Requip [ropinirole hcl] and Chantix [varenicline]  Family History  Problem Relation Age of Onset   Arthritis Mother    Heart attack Mother    Early death Mother    Alcohol abuse Father    Alzheimer's disease Father    Non-Hodgkin's lymphoma Sister    Depression Sister    Alcohol abuse Brother    COPD Brother    Heart attack Brother    Hyperlipidemia Brother    Diabetes Maternal Grandmother    Alcohol abuse Maternal Grandfather    Alzheimer's disease Paternal Grandmother    Drug abuse Son     Social History Social History   Tobacco Use   Smoking status: Every Day    Packs/day: 1.00    Years: 41.00    Pack years: 41.00    Types: Cigarettes   Smokeless tobacco: Never  Substance Use Topics   Alcohol use: No   Drug use: No    Review of Systems  Constitutional: No fever. Eyes: No redness. ENT: Positive for throat discomfort. Cardiovascular: Positive for chest discomfort.   Respiratory: Positive for shortness of breath. Gastrointestinal: No vomiting or diarrhea.  Genitourinary: Negative for flank pain. Musculoskeletal: Negative for back pain. Skin: Positive for hives. Neurological: Negative for headache.   ____________________________________________   PHYSICAL EXAM:  VITAL SIGNS: ED Triage Vitals [02/02/21 2308]  Enc Vitals Group     BP (!) 137/103     Pulse Rate 98     Resp (!) 22     Temp 98.3 F (36.8 C)     Temp Source  Oral     SpO2 92 %     Weight      Height      Head Circumference      Peak Flow      Pain Score      Pain Loc      Pain Edu?      Excl. in GC?     Constitutional: Alert and oriented.  Anxious appearing but in no acute distress. Eyes: Conjunctivae are normal.  Head: Atraumatic. Nose: No congestion/rhinnorhea. Mouth/Throat: Mucous membranes are moist.  Oropharynx clear with no significant swelling or pooled secretions.  Slightly hoarse voice with no stridor. Neck: Normal range of motion.  Cardiovascular: Normal rate, regular rhythm. Grossly normal heart sounds.  Good peripheral circulation. Respiratory: Normal respiratory effort.  No retractions. Lungs CTAB. Gastrointestinal: No distention.  Musculoskeletal: Extremities warm and well perfused.  Neurologic:  Normal speech and language. No gross focal neurologic deficits are appreciated.  Skin:  Skin is warm and dry.  Diffuse urticarial rash to the torso. Psychiatric: Mood and affect are normal. Speech and behavior are normal.  ____________________________________________   LABS (all labs ordered are listed, but only abnormal results are displayed)  Labs Reviewed  BASIC METABOLIC PANEL - Abnormal; Notable for the following components:      Result Value   Glucose, Bld 194 (*)    All other components within normal limits  CBC WITH DIFFERENTIAL/PLATELET - Abnormal; Notable for the following components:   WBC 13.2 (*)    RBC 5.42 (*)    Hemoglobin 17.0 (*)    HCT 51.1 (*)    Neutro Abs 7.8 (*)    Monocytes Absolute 1.1 (*)    All other components within normal limits  TROPONIN I (HIGH SENSITIVITY) - Abnormal; Notable for the following components:   Troponin I (High Sensitivity) 21 (*)    All other components within normal limits  TROPONIN I (HIGH SENSITIVITY) - Abnormal; Notable for the following components:   Troponin I (High Sensitivity) 21 (*)    All other components within normal limits    ____________________________________________  EKG  ED ECG REPORT I, Dionne Bucy, the attending physician, personally viewed and interpreted this ECG.  Date: 02/02/2021 EKG Time: 2316 Rate: 96 Rhythm: normal sinus rhythm QRS Axis: normal Intervals: normal ST/T Wave abnormalities: No acute abnormality; old inferior infarct Narrative Interpretation: no evidence of acute ischemia  ____________________________________________  RADIOLOGY    ____________________________________________   PROCEDURES  Procedure(s) performed: No  Procedures  Critical Care performed: No ____________________________________________   INITIAL IMPRESSION / ASSESSMENT AND PLAN / ED COURSE  Pertinent labs & imaging results that were available during my care of the patient were reviewed by me and considered in my medical decision making (see chart for details).   65 year old female with PMH as noted above including hypertension, hyperlipidemia, and DM presents with an apparent allergic reaction due to unknown cause.  She has a known allergy to few medications but has not had an episode like this before.  On exam the patient is anxious appearing but in no acute distress.  Her vital signs are normal except for her O2 saturation which is in the low 90s on room air.  Patient has a slightly hoarse voice but the oropharynx is clear on exam and there is no stridor.  The lungs are clear to auscultation.  There are urticaria on the torso.  Exam is otherwise as described above.  Overall presentation is consistent with anaphylactic reaction.  The patient states that she had just opened the toothbrush she was using so it is possible that there could be something on the toothbrush that caused this.  The patient took 2 tablets of Benadryl at home.  We will give an EpiPen, Solu-Medrol, Pepcid, and monitor the patient.  ----------------------------------------- 4:32 AM on  02/03/2021 -----------------------------------------  The patient has been here for over 5 hours since receiving the epinephrine and her symptoms have completely resolved with no recurrence.  Her vital signs remain stable.  The initial troponin was minimally elevated, although the patient has not had any chest pain since her initial presentation.  Repeat troponin after 2 hours was unchanged.  The patient  continues to have no chest pain at this time.  I suspect that the minimally elevated troponin could be related to the epinephrine.  There is no evidence of cardiac ischemia or indication for further cardiac work-up or monitoring.  The patient feels comfortable and wants to go home.  I counseled her on the results of the work-up and follow-up plan.  I will prescribe her an EpiPen.  Return precautions given, and she expresses understanding.  ____________________________________________   FINAL CLINICAL IMPRESSION(S) / ED DIAGNOSES  Final diagnoses:  Anaphylaxis, initial encounter      NEW MEDICATIONS STARTED DURING THIS VISIT:  New Prescriptions   EPINEPHRINE (EPIPEN 2-PAK) 0.3 MG/0.3 ML IJ SOAJ INJECTION    Inject 0.3 mg into the muscle as needed for anaphylaxis.     Note:  This document was prepared using Dragon voice recognition software and may include unintentional dictation errors.    Dionne BucySiadecki, Tramon Crescenzo, MD 02/03/21 [phone]

## 2021-02-02 NOTE — ED Triage Notes (Signed)
Pt reports around 2115, she got out of the shower and she started itching, noticed hives all over. Tongue swelling, difficulty swallowing. Saturations 91% on RA in triage. Denies prior allergic reactions.

## 2021-02-02 NOTE — ED Notes (Signed)
EDP notified of pt status, pt placed in treatment room, placed on monitor

## 2021-02-03 LAB — CBC WITH DIFFERENTIAL/PLATELET
Abs Immature Granulocytes: 0.07 10*3/uL (ref 0.00–0.07)
Basophils Absolute: 0.1 10*3/uL (ref 0.0–0.1)
Basophils Relative: 1 %
Eosinophils Absolute: 0.1 10*3/uL (ref 0.0–0.5)
Eosinophils Relative: 1 %
HCT: 51.1 % — ABNORMAL HIGH (ref 36.0–46.0)
Hemoglobin: 17 g/dL — ABNORMAL HIGH (ref 12.0–15.0)
Immature Granulocytes: 1 %
Lymphocytes Relative: 30 %
Lymphs Abs: 4 10*3/uL (ref 0.7–4.0)
MCH: 31.4 pg (ref 26.0–34.0)
MCHC: 33.3 g/dL (ref 30.0–36.0)
MCV: 94.3 fL (ref 80.0–100.0)
Monocytes Absolute: 1.1 10*3/uL — ABNORMAL HIGH (ref 0.1–1.0)
Monocytes Relative: 8 %
Neutro Abs: 7.8 10*3/uL — ABNORMAL HIGH (ref 1.7–7.7)
Neutrophils Relative %: 59 %
Platelets: 359 10*3/uL (ref 150–400)
RBC: 5.42 MIL/uL — ABNORMAL HIGH (ref 3.87–5.11)
RDW: 12 % (ref 11.5–15.5)
WBC: 13.2 10*3/uL — ABNORMAL HIGH (ref 4.0–10.5)
nRBC: 0 % (ref 0.0–0.2)

## 2021-02-03 LAB — BASIC METABOLIC PANEL
Anion gap: 9 (ref 5–15)
BUN: 23 mg/dL (ref 8–23)
CO2: 27 mmol/L (ref 22–32)
Calcium: 9.5 mg/dL (ref 8.9–10.3)
Chloride: 103 mmol/L (ref 98–111)
Creatinine, Ser: 0.69 mg/dL (ref 0.44–1.00)
GFR, Estimated: >60 mL/min (ref >60)
Glucose, Bld: 194 mg/dL — ABNORMAL HIGH (ref 70–99)
Potassium: 4 mmol/L (ref 3.5–5.1)
Sodium: 139 mmol/L (ref 135–145)

## 2021-02-03 LAB — TROPONIN I (HIGH SENSITIVITY)
Troponin I (High Sensitivity): 21 ng/L — ABNORMAL HIGH (ref <18)
Troponin I (High Sensitivity): 21 ng/L — ABNORMAL HIGH (ref <18)

## 2021-02-03 MED ORDER — EPINEPHRINE 0.3 MG/0.3ML IJ SOAJ: 0.3000 mg | INTRAMUSCULAR | 0 refills | Status: AC | PRN

## 2021-02-03 NOTE — Discharge Instructions (Addendum)
Return to the ER for new, worsening, or persistent throat discomfort, tongue or mouth swelling, shortness of breath, hives, or any other new or worsening symptoms that concern you.  If you have a recurrence of similar symptoms you should use the EpiPen.  Follow-up with your regular doctor.

## 2021-02-21 ENCOUNTER — Telehealth: Payer: Self-pay | Admitting: Family Medicine

## 2021-02-21 DIAGNOSIS — E119 Type 2 diabetes mellitus without complications: Secondary | ICD-10-CM

## 2021-02-21 DIAGNOSIS — D72829 Elevated white blood cell count, unspecified: Secondary | ICD-10-CM

## 2021-02-21 DIAGNOSIS — I10 Essential (primary) hypertension: Secondary | ICD-10-CM

## 2021-02-21 NOTE — Telephone Encounter (Signed)
-----   Message from Alvina Chou sent at 02/07/2021 10:45 AM EDT ----- Regarding: Lab orders for Tuesday, 7.12.22 Lab orders for f/u

## 2021-02-22 ENCOUNTER — Other Ambulatory Visit: Payer: Self-pay

## 2021-02-22 ENCOUNTER — Other Ambulatory Visit (INDEPENDENT_AMBULATORY_CARE_PROVIDER_SITE_OTHER): Payer: 59

## 2021-02-22 DIAGNOSIS — D72829 Elevated white blood cell count, unspecified: Secondary | ICD-10-CM

## 2021-02-22 DIAGNOSIS — E119 Type 2 diabetes mellitus without complications: Secondary | ICD-10-CM | POA: Diagnosis not present

## 2021-02-22 DIAGNOSIS — I10 Essential (primary) hypertension: Secondary | ICD-10-CM | POA: Diagnosis not present

## 2021-02-22 LAB — CBC WITH DIFFERENTIAL/PLATELET
Basophils Absolute: 0 10*3/uL (ref 0.0–0.1)
Basophils Relative: 0.5 % (ref 0.0–3.0)
Eosinophils Absolute: 0.3 10*3/uL (ref 0.0–0.7)
Eosinophils Relative: 3.1 % (ref 0.0–5.0)
HCT: 43.9 % (ref 36.0–46.0)
Hemoglobin: 14.8 g/dL (ref 12.0–15.0)
Lymphocytes Relative: 26.3 % (ref 12.0–46.0)
Lymphs Abs: 2.6 10*3/uL (ref 0.7–4.0)
MCHC: 33.8 g/dL (ref 30.0–36.0)
MCV: 94.6 fl (ref 78.0–100.0)
Monocytes Absolute: 0.9 10*3/uL (ref 0.1–1.0)
Monocytes Relative: 8.9 % (ref 3.0–12.0)
Neutro Abs: 6 10*3/uL (ref 1.4–7.7)
Neutrophils Relative %: 61.2 % (ref 43.0–77.0)
Platelets: 319 10*3/uL (ref 150.0–400.0)
RBC: 4.64 Mil/uL (ref 3.87–5.11)
RDW: 13.1 % (ref 11.5–15.5)
WBC: 9.7 10*3/uL (ref 4.0–10.5)

## 2021-02-22 LAB — BASIC METABOLIC PANEL
BUN: 19 mg/dL (ref 6–23)
CO2: 30 mEq/L (ref 19–32)
Calcium: 9.1 mg/dL (ref 8.4–10.5)
Chloride: 100 mEq/L (ref 96–112)
Creatinine, Ser: 0.71 mg/dL (ref 0.40–1.20)
GFR: 89.58 mL/min (ref >60.00)
Glucose, Bld: 126 mg/dL — ABNORMAL HIGH (ref 70–99)
Potassium: 4.2 mEq/L (ref 3.5–5.1)
Sodium: 138 mEq/L (ref 135–145)

## 2021-02-22 LAB — HEMOGLOBIN A1C: Hgb A1c MFr Bld: 7.2 % — ABNORMAL HIGH (ref 4.6–6.5)

## 2021-03-01 ENCOUNTER — Encounter: Payer: Self-pay | Admitting: Family Medicine

## 2021-03-01 ENCOUNTER — Ambulatory Visit (INDEPENDENT_AMBULATORY_CARE_PROVIDER_SITE_OTHER): Payer: 59 | Admitting: Family Medicine

## 2021-03-01 ENCOUNTER — Other Ambulatory Visit: Payer: Self-pay

## 2021-03-01 VITALS — BP 124/78 | HR 63 | Temp 97.9°F | Ht 66.0 in | Wt 228.5 lb

## 2021-03-01 DIAGNOSIS — I10 Essential (primary) hypertension: Secondary | ICD-10-CM | POA: Diagnosis not present

## 2021-03-01 DIAGNOSIS — Z6836 Body mass index (BMI) 36.0-36.9, adult: Secondary | ICD-10-CM

## 2021-03-01 DIAGNOSIS — F17200 Nicotine dependence, unspecified, uncomplicated: Secondary | ICD-10-CM

## 2021-03-01 DIAGNOSIS — E785 Hyperlipidemia, unspecified: Secondary | ICD-10-CM

## 2021-03-01 DIAGNOSIS — E119 Type 2 diabetes mellitus without complications: Secondary | ICD-10-CM

## 2021-03-01 DIAGNOSIS — M25561 Pain in right knee: Secondary | ICD-10-CM

## 2021-03-01 DIAGNOSIS — M545 Low back pain, unspecified: Secondary | ICD-10-CM | POA: Diagnosis not present

## 2021-03-01 DIAGNOSIS — G8929 Other chronic pain: Secondary | ICD-10-CM

## 2021-03-01 DIAGNOSIS — M25562 Pain in left knee: Secondary | ICD-10-CM

## 2021-03-01 DIAGNOSIS — E1169 Type 2 diabetes mellitus with other specified complication: Secondary | ICD-10-CM | POA: Diagnosis not present

## 2021-03-01 MED ORDER — METFORMIN HCL ER 500 MG PO TB24: 1000.0000 mg | ORAL_TABLET | Freq: Every day | ORAL | 3 refills | Status: DC

## 2021-03-01 NOTE — Assessment & Plan Note (Signed)
Lab Results  Component Value Date   HGBA1C 7.2 (H) 02/22/2021   This is up  Less active due to ortho problems  Also eating sweets Plans to improve  Inc metformin xr to 1000 mg daily  Enc exercise as tol On statin and arb  Eye exam utd

## 2021-03-01 NOTE — Assessment & Plan Note (Signed)
Medial pain and some crepitus  Suspect OA Our xray is down today  Ref to orthopedics Will try voltaren gel

## 2021-03-01 NOTE — Assessment & Plan Note (Signed)
Disc in detail risks of smoking and possible outcomes including copd, vascular/ heart disease, cancer , respiratory and sinus infections  Pt voices understanding Not interested in smoking cessation at this time  No resp symptoms

## 2021-03-01 NOTE — Patient Instructions (Addendum)
Voltaren gel may help back and knees (over the counter)   I will work on an orthopedic referral for [place]  You will get a call   Go up on metformin xr to 2 pills each am  Alert [place] if you have side effects   Do the best you can with diabetic diet  Avoid sweets now  Avoid sweet drinks   Try to get most of your carbohydrates from produce (with the exception of white potatoes)  Eat less bread/pasta/rice/snack foods/cereals/sweets and other items from the middle of the grocery store (processed carbs) Be as active as you can be

## 2021-03-01 NOTE — Assessment & Plan Note (Signed)
bilat and acute on chronic  Hurts with any activity  Ref made to orthopedics  Wt loss enc

## 2021-03-01 NOTE — Progress Notes (Signed)
Subjective:    Patient ID: Kathleen Hernandez, female    DOB: 03/09/1956, 65 y.o.   MRN: [id number]  This visit occurred during the SARS-CoV-2 public health emergency.  Safety protocols were in place, including screening questions prior to the visit, additional usage of staff PPE, and extensive cleaning of exam room while observing appropriate contact time as indicated for disinfecting solutions.   HPI Pt presents for 3 mo f/u of chronic health problems including DM2  Wt Readings from Last 3 Encounters:  03/01/21 228 lb 8 oz (103.6 kg)  02/03/21 220 lb (99.8 kg)  11/24/20 228 lb (103.4 kg)   36.88 kg/m  Smoking status -still 1ppd -has not cut down   Diet is the same  Could be better    More back and knee pain lately  Not seeing anyone  Does not have have orthopedics  Lower back pain  Does not rad to legs Acute on chronic -both sides  Any movement makes it worse   Knee pain  Both knees  Worse when storm is coming in  No swelling  Medial pain   Otc - she took 800 mg ibuprofen from her brother  Does not help much  Lab Results  Component Value Date   CREATININE 0.71 02/22/2021   BUN 19 02/22/2021   NA 138 02/22/2021   K 4.2 02/22/2021   CL 100 02/22/2021   CO2 30 02/22/2021       HTN bp is stable today  No cp or palpitations or headaches or edema  No side effects to medicines  BP Readings from Last 3 Encounters:  03/01/21 124/78  02/03/21 128/62  11/24/20 110/62     Taking hctz 25 mg daily  Losartan 100 mg daily   DM2 Lab Results  Component Value Date   HGBA1C 7.2 (H) 02/22/2021   This is up from 7.0  Last visit we added metformin xr 500 mg daily -no side eff  Has done diabetic teaching in the past   Sweets- almost every afternoon  No exercise due to back and knee pain   Eye exam -frequent with recent eye issues On losartan   Hyperlipidemia Lab Results  Component Value Date   CHOL 159 08/02/2020   HDL 47.50 08/02/2020   LDLCALC 82  08/02/2020   TRIG 147.0 08/02/2020   CHOLHDL 3 08/02/2020    Takes atorvastatin 20 mg daily   Patient Active Problem List   Diagnosis Date Noted   Low back pain 03/01/2021   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   Leukocytosis 08/09/2020   Arrhythmia 08/09/2020   Rhinorrhea 07/21/2020   PVC (premature ventricular contraction) 07/21/2020   Knee pain, bilateral 02/10/2020   Mixed incontinence urge and stress 02/10/2020   Encounter for general adult medical examination with abnormal findings 08/05/2019   Type 2 diabetes mellitus without complications (HCC) 08/23/2018   Hyperlipidemia associated with type 2 diabetes mellitus (HCC) 08/31/2017   Smoker 08/31/2017   Hypertension 08/31/2017   Encounter for screening mammogram for breast cancer 08/31/2017   Obesity (BMI 30-39.9) 08/31/2017   Depression with anxiety 08/31/2017   Insomnia 08/31/2017   Past Medical History:  Diagnosis Date   Depression    Diabetes mellitus without complication (HCC)    Family history of heart murmur    History of chickenpox    History of colon polyps    History of depression    History of gastroesophageal reflux (GERD)    History of hyperlipidemia  History of urinary incontinence    Hyperlipidemia    Hypertension    Past Surgical History:  Procedure Laterality Date   BREAST BIOPSY Right 2000   neg   BREAST REDUCTION SURGERY  2010   GALLBLADDER SURGERY  2009   INCONTINENCE SURGERY  2009   PARTIAL HYSTERECTOMY  2009   REDUCTION MAMMAPLASTY Bilateral 2000   Social History   Tobacco Use   Smoking status: Every Day    Packs/day: 1.00    Years: 41.00    Pack years: 41.00    Types: Cigarettes   Smokeless tobacco: Never  Substance Use Topics   Alcohol use: No   Drug use: No   Family History  Problem Relation Age of Onset   Arthritis Mother    Heart attack Mother    Early death Mother    Alcohol abuse Father    Alzheimer's disease Father    Non-Hodgkin's lymphoma  Sister    Depression Sister    Alcohol abuse Brother    COPD Brother    Heart attack Brother    Hyperlipidemia Brother    Diabetes Maternal Grandmother    Alcohol abuse Maternal Grandfather    Alzheimer's disease Paternal Grandmother    Drug abuse Son    Allergies  Allergen Reactions   Requip [Ropinirole Hcl] Other (See Comments)    Tongue swelling   Chantix [Varenicline] Rash   Current Outpatient Medications on File Prior to Visit  Medication Sig Dispense Refill   ACCU-CHEK FASTCLIX LANCETS MISC Check sugar once daily and as needed for diabetes. DX: E11.9 90 each 3   aspirin 325 MG tablet Take 325 mg by mouth daily.     atorvastatin (LIPITOR) 20 MG tablet TAKE 1 TABLET BY MOUTH EVERY DAY 90 tablet 1   Cholecalciferol (VITAMIN D3) 2000 units TABS Take 1 tablet by mouth daily.     EPINEPHrine (EPIPEN 2-PAK) 0.3 mg/0.3 mL IJ SOAJ injection Inject 0.3 mg into the muscle as needed for anaphylaxis. 1 each 0   glucose blood (ACCU-CHEK GUIDE) test strip USE TO CHECK BLOOD SUGAR ONCE DAILY AND AS NEEDED FOR DM (DX. E11.9) 100 strip 1   hydrochlorothiazide (HYDRODIURIL) 25 MG tablet Take 1 tablet (25 mg total) by mouth daily. 90 tablet 3   Lifitegrast (XIIDRA) 5 % SOLN Apply to eye.     losartan (COZAAR) 100 MG tablet Take 1 tablet (100 mg total) by mouth daily. 90 tablet 3   Multiple Vitamins-Minerals (ZINC PO) Take 1 tablet by mouth daily.     Omega-3 Fatty Acids (FISH OIL) 1200 MG CAPS Take 1 capsule by mouth daily.     PARoxetine (PAXIL) 40 MG tablet Take 1 tablet (40 mg total) by mouth daily. 90 tablet 3   Turmeric 400 MG CAPS Take 1 tablet by mouth daily.     vitamin E 400 UNIT capsule Take 400 Units by mouth daily.     zolpidem (AMBIEN) 10 MG tablet TAKE ONE TABLET BY MOUTH EVERY NIGHT AT BEDTIME AS NEEDED FOR SLEEP 30 tablet 3   No current facility-administered medications on file prior to visit.     Review of Systems  Constitutional:  Negative for activity change, appetite  change, fatigue, fever and unexpected weight change.  HENT:  Negative for congestion, ear pain, rhinorrhea, sinus pressure and sore throat.   Eyes:  Negative for pain, redness and visual disturbance.  Respiratory:  Negative for cough, shortness of breath and wheezing.   Cardiovascular:  Negative for chest  pain and palpitations.  Gastrointestinal:  Negative for abdominal pain, blood in stool, constipation and diarrhea.  Endocrine: Negative for polydipsia and polyuria.  Genitourinary:  Negative for dysuria, frequency and urgency.  Musculoskeletal:  Positive for arthralgias and back pain. Negative for joint swelling and myalgias.  Skin:  Negative for pallor and rash.  Allergic/Immunologic: Negative for environmental allergies.  Neurological:  Negative for dizziness, syncope and headaches.  Hematological:  Negative for adenopathy. Does not bruise/bleed easily.  Psychiatric/Behavioral:  Negative for decreased concentration and dysphoric mood. The patient is not nervous/anxious.       Objective:   Physical Exam Constitutional:      General: She is not in acute distress.    Appearance: Normal appearance. She is well-developed. She is obese. She is not ill-appearing or diaphoretic.  HENT:     Head: Normocephalic and atraumatic.  Eyes:     Conjunctiva/sclera: Conjunctivae normal.     Pupils: Pupils are equal, round, and reactive to light.  Neck:     Thyroid: No thyromegaly.     Vascular: No carotid bruit or JVD.  Cardiovascular:     Rate and Rhythm: Normal rate and regular rhythm.     Heart sounds: Normal heart sounds.    No gallop.  Pulmonary:     Effort: Pulmonary effort is normal. No respiratory distress.     Breath sounds: Normal breath sounds. No wheezing or rales.  Abdominal:     General: Bowel sounds are normal. There is no distension or abdominal bruit.     Palpations: Abdomen is soft. There is no mass.     Tenderness: There is no abdominal tenderness.  Musculoskeletal:      Cervical back: Normal range of motion and neck supple.     Right lower leg: No edema.     Left lower leg: No edema.     Comments: Knee pain on full flex and rise from chair  Some crepitus No swelling or effusion   LS -limited rom  Stiff after inactivity  Gait is slow to start  Limited flexion   Lymphadenopathy:     Cervical: No cervical adenopathy.  Skin:    General: Skin is warm and dry.     Coloration: Skin is not pale.     Findings: No rash.     Comments: Very tanned  Neurological:     Mental Status: She is alert.     Sensory: No sensory deficit.     Coordination: Coordination normal.     Deep Tendon Reflexes: Reflexes are normal and symmetric. Reflexes normal.  Psychiatric:        Mood and Affect: Mood normal.        Cognition and Memory: Cognition and memory normal.          Assessment & Plan:   Problem List Items Addressed This Visit       Cardiovascular and Mediastinum   Hypertension    bp in fair control at this time  BP Readings from Last 1 Encounters:  03/01/21 124/78  No changes needed Most recent labs reviewed  Disc lifstyle change with low sodium diet and exercise  Will continue hctz 25 mg daily  Losartan 100 mg daily        Endocrine   Hyperlipidemia associated with type 2 diabetes mellitus (HCC)    Disc goals for lipids and reasons to control them Rev last labs with pt Rev low sat fat diet in detail Plan to continue lipitor 20 mg daily  Relevant Medications   metFORMIN (GLUCOPHAGE XR) 500 MG 24 hr tablet   Type 2 diabetes mellitus without complications (HCC) - Primary    Lab Results  Component Value Date   HGBA1C 7.2 (H) 02/22/2021  This is up  Less active due to ortho problems  Also eating sweets Plans to improve  Inc metformin xr to 1000 mg daily  Enc exercise as tol On statin and arb  Eye exam utd      Relevant Medications   metFORMIN (GLUCOPHAGE XR) 500 MG 24 hr tablet     Other   Smoker    Disc in detail risks  of smoking and possible outcomes including copd, vascular/ heart disease, cancer , respiratory and sinus infections  Pt voices understanding Not interested in smoking cessation at this time  No resp symptoms        Class 2 severe obesity due to excess calories with serious comorbidity and body mass index (BMI) of 36.0 to 36.9 in adult [facility])    Discussed how this problem influences overall health and the risks it imposes  Reviewed plan for weight loss with lower calorie diet (via better food choices and also portion control or program like weight watchers) and exercise building up to or more than 30 minutes 5 days per week including some aerobic activity   Offered ref to healthy weight clinic-declined due to location  Exercise is less due to pain- ortho ref done       Relevant Medications   metFORMIN (GLUCOPHAGE XR) 500 MG 24 hr tablet   Knee pain, bilateral    Medial pain and some crepitus  Suspect OA Our xray is down today  Ref to orthopedics Will try voltaren gel        Relevant Orders   Ambulatory referral to Orthopedic Surgery   Low back pain    bilat and acute on chronic  Hurts with any activity  Ref made to orthopedics  Wt loss enc       Relevant Orders   Ambulatory referral to Orthopedic Surgery

## 2021-03-01 NOTE — Assessment & Plan Note (Signed)
bp in fair control at this time  BP Readings from Last 1 Encounters:  03/01/21 124/78   No changes needed Most recent labs reviewed  Disc lifstyle change with low sodium diet and exercise  Will continue hctz 25 mg daily  Losartan 100 mg daily

## 2021-03-01 NOTE — Assessment & Plan Note (Signed)
Discussed how this problem influences overall health and the risks it imposes  Reviewed plan for weight loss with lower calorie diet (via better food choices and also portion control or program like weight watchers) and exercise building up to or more than 30 minutes 5 days per week including some aerobic activity   Offered ref to healthy weight clinic-declined due to location  Exercise is less due to pain- ortho ref done

## 2021-03-01 NOTE — Assessment & Plan Note (Signed)
Disc goals for lipids and reasons to control them Rev last labs with pt Rev low sat fat diet in detail Plan to continue lipitor 20 mg daily

## 2021-03-16 ENCOUNTER — Encounter: Payer: Self-pay | Admitting: Family Medicine

## 2021-04-01 ENCOUNTER — Other Ambulatory Visit: Payer: Self-pay | Admitting: Family Medicine

## 2021-04-27 ENCOUNTER — Other Ambulatory Visit: Payer: Self-pay | Admitting: Family Medicine

## 2021-05-03 DIAGNOSIS — E7800 Pure hypercholesterolemia, unspecified: Secondary | ICD-10-CM | POA: Diagnosis not present

## 2021-05-03 DIAGNOSIS — Z0100 Encounter for examination of eyes and vision without abnormal findings: Secondary | ICD-10-CM | POA: Diagnosis not present

## 2021-05-03 DIAGNOSIS — H5200 Hypermetropia, unspecified eye: Secondary | ICD-10-CM | POA: Diagnosis not present

## 2021-05-03 DIAGNOSIS — E109 Type 1 diabetes mellitus without complications: Secondary | ICD-10-CM | POA: Diagnosis not present

## 2021-05-03 DIAGNOSIS — I10 Essential (primary) hypertension: Secondary | ICD-10-CM | POA: Diagnosis not present

## 2021-05-05 ENCOUNTER — Other Ambulatory Visit: Payer: Self-pay | Admitting: Family Medicine

## 2021-05-06 NOTE — Telephone Encounter (Signed)
Name of Medication: Ambien Name of Pharmacy: [facility] [address]. Last [place] or Written Date and Quantity: 10/26/20 #30 tabs with 3 refills Last Office Visit and Type: f/u 03/01/21 Next Office Visit and Type:f/u on 06/01/21 Last Controlled Substance Agreement Date: no UDS Last UDS: no UDS

## 2021-05-14 LAB — HM DIABETES EYE EXAM

## 2021-05-20 ENCOUNTER — Telehealth (INDEPENDENT_AMBULATORY_CARE_PROVIDER_SITE_OTHER): Payer: 59 | Admitting: Family Medicine

## 2021-05-20 ENCOUNTER — Encounter: Payer: Self-pay | Admitting: Family Medicine

## 2021-05-20 ENCOUNTER — Other Ambulatory Visit: Payer: Self-pay

## 2021-05-20 VITALS — Temp 98.7°F | Ht 66.0 in | Wt 225.0 lb

## 2021-05-20 DIAGNOSIS — U071 COVID-19: Secondary | ICD-10-CM | POA: Insufficient documentation

## 2021-05-20 MED ORDER — MOLNUPIRAVIR EUA 200MG CAPSULE: 4.0000 | ORAL_CAPSULE | Freq: Two times a day (BID) | ORAL | 0 refills | Status: AC

## 2021-05-20 NOTE — Assessment & Plan Note (Signed)
COVID19  Infection < 5 days from onset of symptoms in UNvaccinated obeset individual with history of  DM, HTN  No clear sign of bacterial infection at this time.   No SOB.  No red flags/need for ER visit or in-person exam at respiratory clinic at this time..    Pt higher risk for COVID complications given  DM, HTN and obesity and unvaccinated.. Medication contraindications ot Paxlovid.  Start molnupiravir 5 day course. Reviewed course of medication and side effect profile with patient in detail.   Symptomatic care with mucinex and tylenol If SOB begins symptoms worsening.. have low threshold for in-person exam, if severe shortness of breath ER visit recommended.  Can monitor Oxygen saturation at home with home monitor if able to obtain.  Go to ER if O2 sat < 90% on room air.   Reviewed home care and provided information through MyChart.  Recommended quarantine 5 days isolation recommended. Return to work day 6 and wear mask for 4 more days to complete 10 days. Provided info about prevention of spread of COVID 19.

## 2021-05-20 NOTE — Progress Notes (Signed)
VIRTUAL VISIT Due to national recommendations of social distancing due to COVID 19, a virtual visit is felt to be most appropriate for this patient at this time.   I connected with the patient on 05/20/21 at 12:00 PM EDT by virtual telehealth platform and verified that I am speaking with the correct person using two identifiers.   I discussed the limitations, risks, security and privacy concerns of performing an evaluation and management service by  virtual telehealth platform and the availability of in person appointments. I also discussed with the patient that there may be a patient responsible charge related to this service. The patient expressed understanding and agreed to proceed.  Patient location: Home Provider Location: [place] Participants: Kerby Nora and [name]   Chief Complaint  Patient presents with   Covid Positive   Fever   Headache   Chills   Nasal Congestion   Cough   Generalized Body Aches    History of Present Illness:  66 year old unvaccinated  female patient of Dr. Royden Purl with history of obesity, DM type 2, HTN  presents with COVID infection.   She reports she started feeling bad in last 24 hours. Started with headache, chills, low grade fever.  Also notes congestion and cough, dry.  Has myalgia,  and fatigue.  NO SOB, no wheeze.  Good  water intake, no appetite.   She has tried tylenol and ibuprofen for symptoms.   No history of chronic lung disease.  COVID 19 screen COVID testing: 05/20/2021 home yes COVID vaccine:unvaccinated COVID exposure: No recent travel or known exposure to COVID19  The importance of social distancing was discussed today.    Review of Systems  Constitutional:  Negative for chills and fever.  HENT:  Positive for congestion.   Eyes:  Negative for pain and redness.  Respiratory:  Positive for cough. Negative for shortness of breath.   Cardiovascular:  Negative for chest pain, palpitations and leg  swelling.  Gastrointestinal:  Negative for abdominal pain, blood in stool, constipation, diarrhea, nausea and vomiting.  Genitourinary:  Negative for dysuria.  Musculoskeletal:  Negative for falls and myalgias.  Skin:  Negative for rash.  Neurological:  Negative for dizziness.  Psychiatric/Behavioral:  Negative for depression. The patient is not nervous/anxious.      Past Medical History:  Diagnosis Date   Depression    Diabetes mellitus without complication (HCC)    Family history of heart murmur    History of chickenpox    History of colon polyps    History of depression    History of gastroesophageal reflux (GERD)    History of hyperlipidemia    History of urinary incontinence    Hyperlipidemia    Hypertension     reports that she has been smoking cigarettes. She has a 41.00 pack-year smoking history. She has never used smokeless tobacco. She reports that she does not drink alcohol and does not use drugs.   Current Outpatient Medications:    ACCU-CHEK FASTCLIX LANCETS MISC, Check sugar once daily and as needed for diabetes. DX: E11.9, Disp: 90 each, Rfl: 3   aspirin 325 MG tablet, Take 325 mg by mouth daily., Disp: , Rfl:    atorvastatin (LIPITOR) 20 MG tablet, TAKE 1 TABLET BY MOUTH EVERY DAY, Disp: 90 tablet, Rfl: 1   Cholecalciferol (VITAMIN D3) 2000 units TABS, Take 1 tablet by mouth daily., Disp: , Rfl:    EPINEPHrine (EPIPEN 2-PAK) 0.3 mg/0.3 mL IJ SOAJ injection, Inject 0.3  mg into the muscle as needed for anaphylaxis., Disp: 1 each, Rfl: 0   glucose blood (ACCU-CHEK GUIDE) test strip, USE TO CHECK BLOOD SUGAR ONCE DAILY AND AS NEEDED FOR DM (DX. E11.9), Disp: 100 strip, Rfl: 1   hydrochlorothiazide (HYDRODIURIL) 25 MG tablet, TAKE 1 TABLET (25 MG TOTAL) BY MOUTH DAILY., Disp: 90 tablet, Rfl: 0   Lifitegrast (XIIDRA) 5 % SOLN, Apply to eye., Disp: , Rfl:    losartan (COZAAR) 100 MG tablet, TAKE 1 TABLET BY MOUTH EVERY DAY, Disp: 90 tablet, Rfl: 0   metFORMIN (GLUCOPHAGE  XR) 500 MG 24 hr tablet, Take 2 tablets (1,000 mg total) by mouth daily with breakfast., Disp: 180 tablet, Rfl: 3   Multiple Vitamins-Minerals (ZINC PO), Take 1 tablet by mouth daily., Disp: , Rfl:    Omega-3 Fatty Acids (FISH OIL) 1200 MG CAPS, Take 1 capsule by mouth daily., Disp: , Rfl:    PARoxetine (PAXIL) 40 MG tablet, Take 1 tablet (40 mg total) by mouth daily., Disp: 90 tablet, Rfl: 3   Turmeric 400 MG CAPS, Take 1 tablet by mouth daily., Disp: , Rfl:    vitamin E 400 UNIT capsule, Take 400 Units by mouth daily., Disp: , Rfl:    zolpidem (AMBIEN) 10 MG tablet, TAKE ONE TABLET BY MOUTH EVERY NIGHT AT BEDTIME AS NEEDED FOR SLEEP, Disp: 30 tablet, Rfl: 3   Observations/Objective: Temperature 98.7 F (37.1 C), temperature source Temporal, height 5\' 6"  (1.676 m), weight 225 lb (102.1 kg).  Physical Exam  Physical Exam Constitutional:      General: The patient is not in acute distress. Pulmonary:     Effort: Pulmonary effort is normal. No respiratory distress.  Neurological:     Mental Status: The patient is alert and oriented to person, place, and time.  Psychiatric:        Mood and Affect: Mood normal.        Behavior: Behavior normal.   Assessment and Plan    Problem List Items Addressed This Visit     COVID-19 virus infection - Primary    COVID19  Infection < 5 days from onset of symptoms in UNvaccinated obeset individual with history of  DM, HTN  No clear sign of bacterial infection at this time.   No SOB.  No red flags/need for ER visit or in-person exam at respiratory clinic at this time..    Pt higher risk for COVID complications given  DM, HTN and obesity and unvaccinated.. Medication contraindications ot Paxlovid.  Start molnupiravir 5 day course. Reviewed course of medication and side effect profile with patient in detail.   Symptomatic care with mucinex and tylenol If SOB begins symptoms worsening.. have low threshold for in-person exam, if severe shortness of  breath ER visit recommended.  Can monitor Oxygen saturation at home with home monitor if able to obtain.  Go to ER if O2 sat < 90% on room air.   Reviewed home care and provided information through MyChart.  Recommended quarantine 5 days isolation recommended. Return to work day 6 and wear mask for 4 more days to complete 10 days. Provided info about prevention of spread of COVID 19.       Relevant Medications   molnupiravir EUA (LAGEVRIO) 200 mg CAPS capsule   Meds ordered this encounter  Medications   molnupiravir EUA (LAGEVRIO) 200 mg CAPS capsule    Sig: Take 4 capsules (800 mg total) by mouth 2 (two) times daily for 5 days.  Dispense:  40 capsule    Refill:  0    I discussed the assessment and treatment plan with the patient. The patient was provided an opportunity to ask questions and all were answered. The patient agreed with the plan and demonstrated an understanding of the instructions.   The patient was advised to call back or seek an in-person evaluation if the symptoms worsen or if the condition fails to improve as anticipated.     Kerby Nora, MD

## 2021-06-01 ENCOUNTER — Telehealth (INDEPENDENT_AMBULATORY_CARE_PROVIDER_SITE_OTHER): Payer: 59 | Admitting: Family Medicine

## 2021-06-01 ENCOUNTER — Ambulatory Visit: Payer: 59 | Admitting: Family Medicine

## 2021-06-01 ENCOUNTER — Encounter: Payer: Self-pay | Admitting: Family Medicine

## 2021-06-01 ENCOUNTER — Other Ambulatory Visit: Payer: Self-pay

## 2021-06-01 DIAGNOSIS — F17200 Nicotine dependence, unspecified, uncomplicated: Secondary | ICD-10-CM | POA: Diagnosis not present

## 2021-06-01 DIAGNOSIS — U071 COVID-19: Secondary | ICD-10-CM

## 2021-06-01 DIAGNOSIS — E1169 Type 2 diabetes mellitus with other specified complication: Secondary | ICD-10-CM | POA: Diagnosis not present

## 2021-06-01 DIAGNOSIS — I10 Essential (primary) hypertension: Secondary | ICD-10-CM

## 2021-06-01 DIAGNOSIS — E119 Type 2 diabetes mellitus without complications: Secondary | ICD-10-CM

## 2021-06-01 DIAGNOSIS — E785 Hyperlipidemia, unspecified: Secondary | ICD-10-CM

## 2021-06-01 MED ORDER — PROMETHAZINE-DM 6.25-15 MG/5ML PO SYRP
5.0000 mL | ORAL_SOLUTION | Freq: Four times a day (QID) | ORAL | 0 refills | Status: DC | PRN
Start: 2021-06-01 — End: 2021-10-27

## 2021-06-01 MED ORDER — ALBUTEROL SULFATE HFA 108 (90 BASE) MCG/ACT IN AERS: 2.0000 | INHALATION_SPRAY | RESPIRATORY_TRACT | 1 refills | Status: DC | PRN

## 2021-06-01 NOTE — Patient Instructions (Signed)
Please review mychart message 03/16/21 and call the orthopedic office for an appt (Dr Real Cons) for knee and back pain   Keep working on a diabetic diet   Call the [place] station office to set up time to get an a1c  Try the cough syrup with caution of sedation  Use albuterol inhaler as needed for wheezing   Update if not starting to improve in a week or if worsening   If severely short of breath please go to the ER

## 2021-06-01 NOTE — Assessment & Plan Note (Signed)
Much improved but some residual dry cough  Scant wheeze occ (is a smoker) Counseled on smoking cessation  Px prometh-dm for cough with caution of sedation Also albuterol mdi  Update if not starting to improve in a week or if worsening  Meds ordered this encounter  Medications  . promethazine-dextromethorphan (PROMETHAZINE-DM) 6.25-15 MG/5ML syrup    Sig: Take 5 mLs by mouth 4 (four) times daily as needed for cough.    Dispense:  118 mL    Refill:  0  . albuterol (VENTOLIN HFA) 108 (90 Base) MCG/ACT inhaler    Sig: Inhale 2 puffs into the lungs every 4 (four) hours as needed for wheezing.    Dispense:  1 each    Refill:  1

## 2021-06-01 NOTE — Assessment & Plan Note (Signed)
Pt is not aware of any changes  Does not check bp at home Plan to continue hctz 25 mg daily and losartan 100 mg daily

## 2021-06-01 NOTE — Assessment & Plan Note (Signed)
Disc goals for lipids and reasons to control them Rev last labs with pt Rev low sat fat diet in detail Taking atorvastatin 20 mg daily  LDL of 82 Diet is improved

## 2021-06-01 NOTE — Progress Notes (Signed)
Virtual Visit via Video Note  I connected with [name] on 06/01/21 at  8:30 AM EDT by a video enabled telemedicine application and verified that I am speaking with the correct person using two identifiers.  Location: Patient: home Provider: office   I discussed the limitations of evaluation and management by telemedicine and the availability of in person appointments. The patient expressed understanding and agreed to proceed.  Parties involved in encounter  Patient: Kathleen Hernandez  Provider:  Roxy Manns MD   History of Present Illness: Pt presents for f/u of DM and other chronic medical problems   Had covid this month and tx with molnupiravir She had diarrhea  Stopped after 3 d , but still got better  Cough is hanging on  Taking tessalon Not strong enough-needs more Non productive  Perhaps a little wheezing   DM 2  Lab Results  Component Value Date   HGBA1C 7.2 (H) 02/22/2021   At that time was less active due to orthopedic problems and also eating sweets  We inc her metrofmin xr to 1000 mg daily (tolerates well)   Since then has done away with all added sugar  Thinks it will look better next time  Does not check her glucose  No episodes of low sugar symptoms   Ortho- has not been yet -did not get the message to schedule an appt  Knee and back pain  Worse with weather change   Taking statin and arb Eye exam-up to date 3/22  Lab Results  Component Value Date   CHOL 159 08/02/2020   HDL 47.50 08/02/2020   LDLCALC 82 08/02/2020   TRIG 147.0 08/02/2020   CHOLHDL 3 08/02/2020   Atorvastatin 20 mg daily   HTN   Well controlled  BP Readings from Last 3 Encounters:  03/01/21 124/78  02/03/21 128/62  11/24/20 110/62   Does not check at home  Hctz 25 mg daily  Losartan 100 mg daily   Lab Results  Component Value Date   CREATININE 0.71 02/22/2021   BUN 19 02/22/2021   NA 138 02/22/2021   K 4.2 02/22/2021   CL 100 02/22/2021   CO2 30  02/22/2021   Smoking status :  no change  Not motivated to quit  Patient Active Problem List   Diagnosis Date Noted   COVID-19 virus infection 05/20/2021   Low back pain 03/01/2021   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   Leukocytosis 08/09/2020   Arrhythmia 08/09/2020   Rhinorrhea 07/21/2020   PVC (premature ventricular contraction) 07/21/2020   Knee pain, bilateral 02/10/2020   Mixed incontinence urge and stress 02/10/2020   Encounter for general adult medical examination with abnormal findings 08/05/2019   Type 2 diabetes mellitus without complications (HCC) 08/23/2018   Hyperlipidemia associated with type 2 diabetes mellitus (HCC) 08/31/2017   Smoker 08/31/2017   Hypertension 08/31/2017   Encounter for screening mammogram for breast cancer 08/31/2017   Class 2 severe obesity due to excess calories with serious comorbidity and body mass index (BMI) of 36.0 to 36.9 in adult [facility]) 08/31/2017   Depression with anxiety 08/31/2017   Insomnia 08/31/2017   Past Medical History:  Diagnosis Date   Depression    Diabetes mellitus without complication (HCC)    Family history of heart murmur    History of chickenpox    History of colon polyps    History of depression    History of gastroesophageal reflux (GERD)    History of hyperlipidemia  History of urinary incontinence    Hyperlipidemia    Hypertension    Past Surgical History:  Procedure Laterality Date   BREAST BIOPSY Right 2000   neg   BREAST REDUCTION SURGERY  2010   GALLBLADDER SURGERY  2009   INCONTINENCE SURGERY  2009   PARTIAL HYSTERECTOMY  2009   REDUCTION MAMMAPLASTY Bilateral 2000   Social History   Tobacco Use   Smoking status: Every Day    Packs/day: 1.00    Years: 41.00    Pack years: 41.00    Types: Cigarettes   Smokeless tobacco: Never  Substance Use Topics   Alcohol use: No   Drug use: No   Family History  Problem Relation Age of Onset   Arthritis Mother    Heart attack  Mother    Early death Mother    Alcohol abuse Father    Alzheimer's disease Father    Non-Hodgkin's lymphoma Sister    Depression Sister    Alcohol abuse Brother    COPD Brother    Heart attack Brother    Hyperlipidemia Brother    Diabetes Maternal Grandmother    Alcohol abuse Maternal Grandfather    Alzheimer's disease Paternal Grandmother    Drug abuse Son    Allergies  Allergen Reactions   Requip [Ropinirole Hcl] Other (See Comments)    Tongue swelling   Chantix [Varenicline] Rash   Current Outpatient Medications on File Prior to Visit  Medication Sig Dispense Refill   ACCU-CHEK FASTCLIX LANCETS MISC Check sugar once daily and as needed for diabetes. DX: E11.9 90 each 3   aspirin 325 MG tablet Take 325 mg by mouth daily.     atorvastatin (LIPITOR) 20 MG tablet TAKE 1 TABLET BY MOUTH EVERY DAY 90 tablet 1   Cholecalciferol (VITAMIN D3) 2000 units TABS Take 1 tablet by mouth daily.     EPINEPHrine (EPIPEN 2-PAK) 0.3 mg/0.3 mL IJ SOAJ injection Inject 0.3 mg into the muscle as needed for anaphylaxis. 1 each 0   glucose blood (ACCU-CHEK GUIDE) test strip USE TO CHECK BLOOD SUGAR ONCE DAILY AND AS NEEDED FOR DM (DX. E11.9) 100 strip 1   hydrochlorothiazide (HYDRODIURIL) 25 MG tablet TAKE 1 TABLET (25 MG TOTAL) BY MOUTH DAILY. 90 tablet 0   losartan (COZAAR) 100 MG tablet TAKE 1 TABLET BY MOUTH EVERY DAY 90 tablet 0   metFORMIN (GLUCOPHAGE XR) 500 MG 24 hr tablet Take 2 tablets (1,000 mg total) by mouth daily with breakfast. 180 tablet 3   Multiple Vitamins-Minerals (ZINC PO) Take 1 tablet by mouth daily.     Omega-3 Fatty Acids (FISH OIL) 1200 MG CAPS Take 1 capsule by mouth daily.     PARoxetine (PAXIL) 40 MG tablet Take 1 tablet (40 mg total) by mouth daily. 90 tablet 3   Turmeric 400 MG CAPS Take 1 tablet by mouth daily.     vitamin E 400 UNIT capsule Take 400 Units by mouth daily.     zolpidem (AMBIEN) 10 MG tablet TAKE ONE TABLET BY MOUTH EVERY NIGHT AT BEDTIME AS NEEDED FOR  SLEEP 30 tablet 3   No current facility-administered medications on file prior to visit.   Review of Systems  Constitutional:  Negative for chills, fever and malaise/fatigue.  HENT:  Negative for congestion, ear pain, sinus pain and sore throat.   Eyes:  Negative for blurred vision, discharge and redness.  Respiratory:  Positive for cough and wheezing. Negative for sputum production, shortness of breath and stridor.  Cardiovascular:  Negative for chest pain, palpitations and leg swelling.  Gastrointestinal:  Negative for abdominal pain, diarrhea, nausea and vomiting.  Musculoskeletal:  Negative for myalgias.  Skin:  Negative for rash.  Neurological:  Negative for dizziness and headaches.    Observations/Objective: Patient appears well, in no distress Weight is baseline  No facial swelling or asymmetry Normal voice-not hoarse and no slurred speech No obvious tremor or mobility impairment Moving neck and UEs normally Able to hear the call well  No wheeze or shortness of breath during interview  Occ dry cough and throat clearing Talkative and mentally sharp with no cognitive changes No skin changes on face or neck , no rash or pallor Affect is normal    Assessment and Plan: Problem List Items Addressed This Visit       Cardiovascular and Mediastinum   Hypertension    Pt is not aware of any changes  Does not check bp at home Plan to continue hctz 25 mg daily and losartan 100 mg daily        Endocrine   Hyperlipidemia associated with type 2 diabetes mellitus (HCC)    Disc goals for lipids and reasons to control them Rev last labs with pt Rev low sat fat diet in detail Taking atorvastatin 20 mg daily  LDL of 82 Diet is improved      Type 2 diabetes mellitus without complications (HCC) - Primary    Lab Results  Component Value Date   HGBA1C 7.2 (H) 02/22/2021  Due for this-ordered so she can get it done at [facility] office  No problems with metformin xr 1000 mg  daily  Diet is better, no longer eating sweets or added sugar Activity still low due to knee/back pain  Eye exam utd Good foot care Taking arb and statin  Will plan f/u with result      Relevant Orders   POCT glycosylated hemoglobin (Hb A1C)     Other   Smoker    Not ready to quit Aware this perpetuates her cough after covid      COVID-19 virus infection    Much improved but some residual dry cough  Scant wheeze occ (is a smoker) Counseled on smoking cessation  Px prometh-dm for cough with caution of sedation Also albuterol mdi  Update if not starting to improve in a week or if worsening  Meds ordered this encounter  Medications   promethazine-dextromethorphan (PROMETHAZINE-DM) 6.25-15 MG/5ML syrup    Sig: Take 5 mLs by mouth 4 (four) times daily as needed for cough.    Dispense:  118 mL    Refill:  0   albuterol (VENTOLIN HFA) 108 (90 Base) MCG/ACT inhaler    Sig: Inhale 2 puffs into the lungs every 4 (four) hours as needed for wheezing.    Dispense:  1 each    Refill:  1           Follow Up Instructions: Please review mychart message 03/16/21 and call the orthopedic office for an appt (Dr Real Cons) for knee and back pain   Keep working on a diabetic diet   Call the [place] [facility] office to set up time to get an a1c  Try the cough syrup with caution of sedation  Use albuterol inhaler as needed for wheezing   Update if not starting to improve in a week or if worsening   If severely short of breath please go to the ER   I discussed the assessment and treatment plan  with the patient. The patient was provided an opportunity to ask questions and all were answered. The patient agreed with the plan and demonstrated an understanding of the instructions.   The patient was advised to call back or seek an in-person evaluation if the symptoms worsen or if the condition fails to improve as anticipated.     Roxy Manns, MD

## 2021-06-01 NOTE — Assessment & Plan Note (Signed)
Not ready to quit Aware this perpetuates her cough after covid

## 2021-06-01 NOTE — Assessment & Plan Note (Signed)
Lab Results  Component Value Date   HGBA1C 7.2 (H) 02/22/2021   Due for this-ordered so she can get it done at [facility] office  No problems with metformin xr 1000 mg daily  Diet is better, no longer eating sweets or added sugar Activity still low due to knee/back pain  Eye exam utd Good foot care Taking arb and statin  Will plan f/u with result

## 2021-06-06 ENCOUNTER — Telehealth: Payer: Self-pay | Admitting: Family Medicine

## 2021-06-06 DIAGNOSIS — E785 Hyperlipidemia, unspecified: Secondary | ICD-10-CM

## 2021-06-06 DIAGNOSIS — I10 Essential (primary) hypertension: Secondary | ICD-10-CM

## 2021-06-06 DIAGNOSIS — E1169 Type 2 diabetes mellitus with other specified complication: Secondary | ICD-10-CM

## 2021-06-06 DIAGNOSIS — E119 Type 2 diabetes mellitus without complications: Secondary | ICD-10-CM

## 2021-06-06 NOTE — Telephone Encounter (Signed)
Patient has a lab appt 06/06/2021, there is no order in.

## 2021-06-06 NOTE — Addendum Note (Signed)
Addended by: Roxy Manns A on: 06/06/2021 08:23 PM   Modules accepted: Orders

## 2021-06-07 ENCOUNTER — Other Ambulatory Visit: Payer: 59

## 2021-06-07 ENCOUNTER — Ambulatory Visit: Payer: 59

## 2021-06-09 ENCOUNTER — Other Ambulatory Visit: Payer: Self-pay

## 2021-06-09 ENCOUNTER — Other Ambulatory Visit (INDEPENDENT_AMBULATORY_CARE_PROVIDER_SITE_OTHER): Payer: Medicare HMO

## 2021-06-09 DIAGNOSIS — I10 Essential (primary) hypertension: Secondary | ICD-10-CM | POA: Diagnosis not present

## 2021-06-09 DIAGNOSIS — E1169 Type 2 diabetes mellitus with other specified complication: Secondary | ICD-10-CM | POA: Diagnosis not present

## 2021-06-09 DIAGNOSIS — E785 Hyperlipidemia, unspecified: Secondary | ICD-10-CM

## 2021-06-09 DIAGNOSIS — E119 Type 2 diabetes mellitus without complications: Secondary | ICD-10-CM

## 2021-06-09 LAB — COMPREHENSIVE METABOLIC PANEL
ALT: 13 U/L (ref 0–35)
AST: 14 U/L (ref 0–37)
Albumin: 4 g/dL (ref 3.5–5.2)
Alkaline Phosphatase: 106 U/L (ref 39–117)
BUN: 16 mg/dL (ref 6–23)
CO2: 32 mEq/L (ref 19–32)
Calcium: 9 mg/dL (ref 8.4–10.5)
Chloride: 103 mEq/L (ref 96–112)
Creatinine, Ser: 0.63 mg/dL (ref 0.40–1.20)
GFR: 93.27 mL/min (ref >60.00)
Glucose, Bld: 101 mg/dL — ABNORMAL HIGH (ref 70–99)
Potassium: 4.9 mEq/L (ref 3.5–5.1)
Sodium: 141 mEq/L (ref 135–145)
Total Bilirubin: 0.5 mg/dL (ref 0.2–1.2)
Total Protein: 6.5 g/dL (ref 6.0–8.3)

## 2021-06-09 LAB — LIPID PANEL
Cholesterol: 142 mg/dL (ref 0–200)
HDL: 43.2 mg/dL (ref >39.00)
LDL Cholesterol: 74 mg/dL (ref 0–99)
NonHDL: 98.61
Total CHOL/HDL Ratio: 3
Triglycerides: 123 mg/dL (ref 0.0–149.0)
VLDL: 24.6 mg/dL (ref 0.0–40.0)

## 2021-06-09 LAB — HEMOGLOBIN A1C: Hgb A1c MFr Bld: 7 % — ABNORMAL HIGH (ref 4.6–6.5)

## 2021-06-30 ENCOUNTER — Other Ambulatory Visit: Payer: Self-pay | Admitting: Family Medicine

## 2021-07-01 ENCOUNTER — Other Ambulatory Visit: Payer: Self-pay | Admitting: Family Medicine

## 2021-08-16 IMAGING — MG MM DIGITAL SCREENING BILAT W/ TOMO AND CAD
8 series · 8 of 24 positions shown · non-contrast
Comparison: Previous exam(s).

CLINICAL DATA: Screening.

EXAM:
DIGITAL SCREENING BILATERAL MAMMOGRAM WITH TOMOSYNTHESIS AND CAD
TECHNIQUE: Bilateral screening digital craniocaudal and mediolateral oblique
mammograms were obtained. Bilateral screening digital breast
tomosynthesis was performed. The images were evaluated with
computer-aided detection.

[L CC synth-2D]
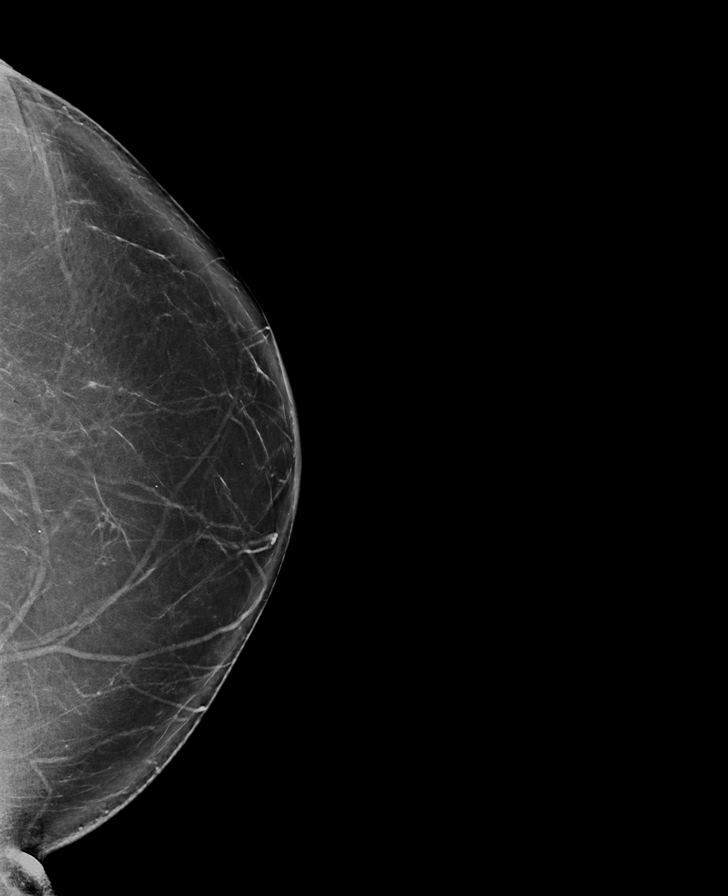

[R MLO synth-2D]
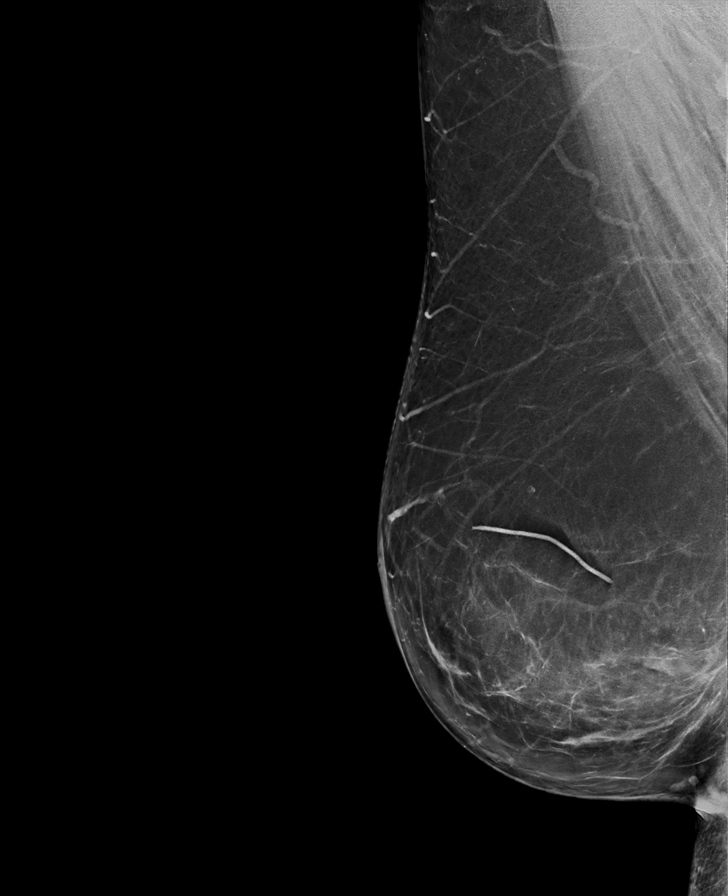

[R CC synth-2D]
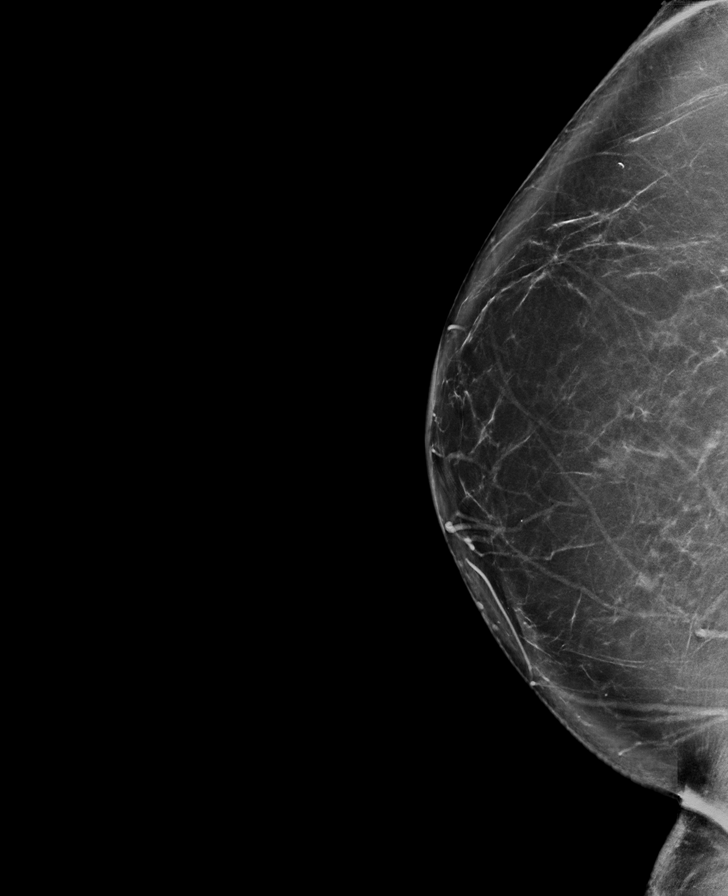

[L MLO synth-2D]
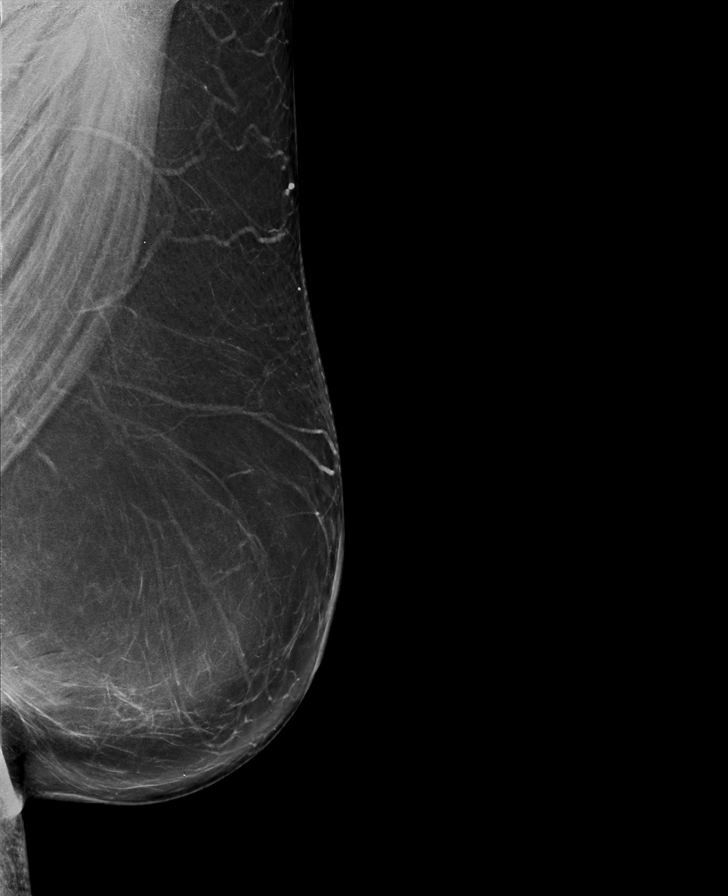

[L CC tomo · tomo slice 45/88.0]
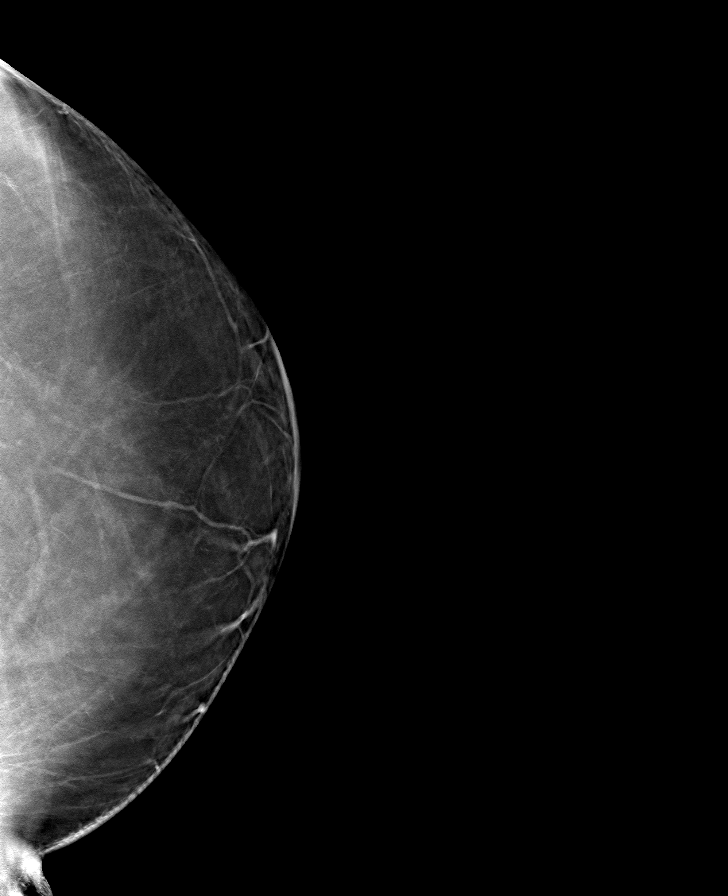

[R MLO tomo · tomo slice 49/96.0]
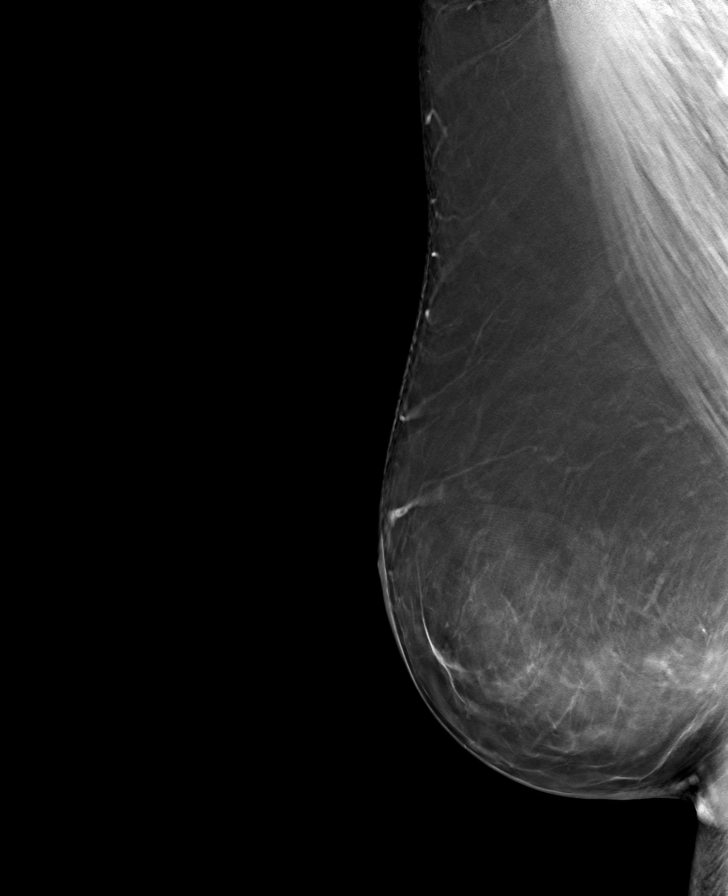

[R CC tomo · tomo slice 47/93.0]
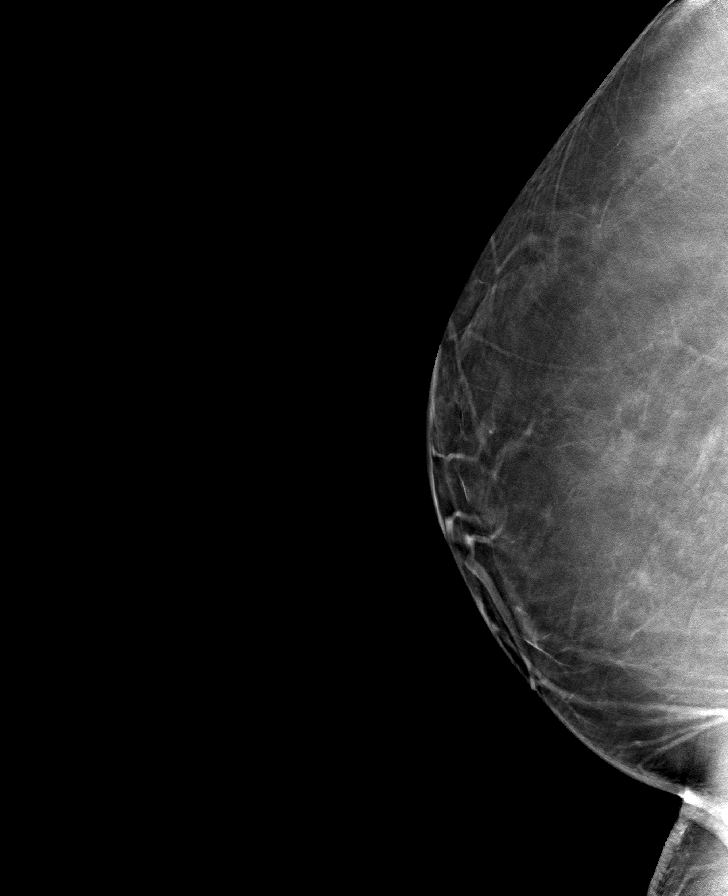

[L MLO tomo · tomo slice 49/97.0]
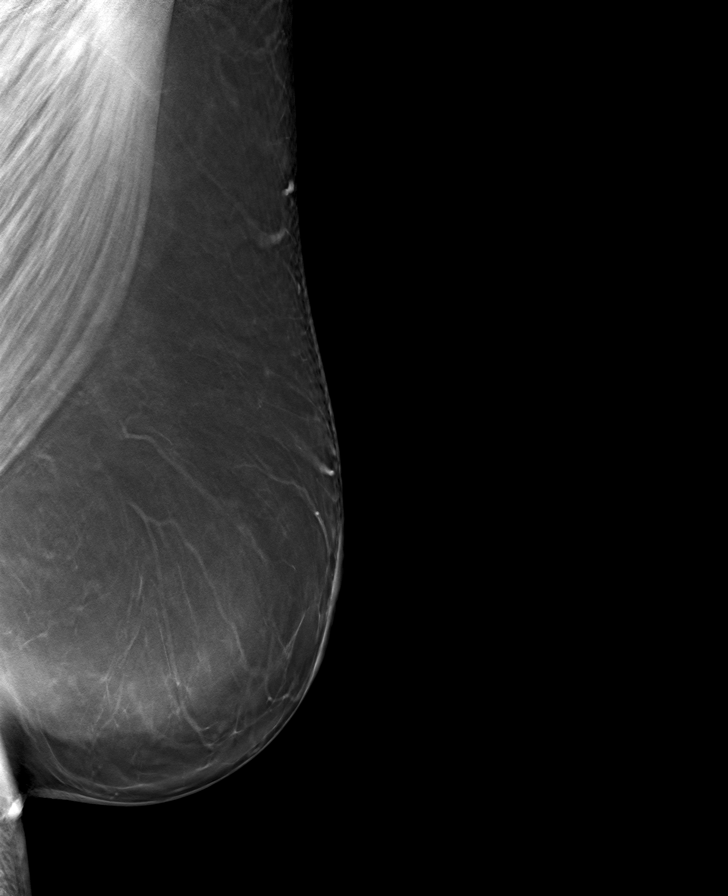

[8 of 24 positions shown; findings below may reference images not displayed]

ACR Breast Density Category b: There are scattered areas of
fibroglandular density.
FINDINGS: There are no findings suspicious for malignancy. The images were
evaluated with computer-aided detection.
IMPRESSION: No mammographic evidence of malignancy. A result letter of this
screening mammogram will be mailed directly to the patient.

RECOMMENDATION:
Screening mammogram in one year. (Code:[id number])

BI-RADS CATEGORY  1: Negative.

## 2021-10-07 ENCOUNTER — Other Ambulatory Visit: Payer: Self-pay | Admitting: Family Medicine

## 2021-10-19 ENCOUNTER — Other Ambulatory Visit: Payer: Self-pay | Admitting: Family Medicine

## 2021-10-25 ENCOUNTER — Encounter: Payer: Self-pay | Admitting: Family Medicine

## 2021-10-27 ENCOUNTER — Other Ambulatory Visit: Payer: Self-pay

## 2021-10-27 ENCOUNTER — Ambulatory Visit (INDEPENDENT_AMBULATORY_CARE_PROVIDER_SITE_OTHER): Payer: Medicare HMO | Admitting: Family Medicine

## 2021-10-27 ENCOUNTER — Encounter: Payer: Self-pay | Admitting: Family Medicine

## 2021-10-27 VITALS — BP 124/70 | HR 65 | Temp 97.4°F | Ht 66.0 in | Wt 222.4 lb

## 2021-10-27 DIAGNOSIS — E785 Hyperlipidemia, unspecified: Secondary | ICD-10-CM

## 2021-10-27 DIAGNOSIS — Z6835 Body mass index (BMI) 35.0-35.9, adult: Secondary | ICD-10-CM | POA: Diagnosis not present

## 2021-10-27 DIAGNOSIS — R69 Illness, unspecified: Secondary | ICD-10-CM | POA: Diagnosis not present

## 2021-10-27 DIAGNOSIS — E119 Type 2 diabetes mellitus without complications: Secondary | ICD-10-CM

## 2021-10-27 DIAGNOSIS — I10 Essential (primary) hypertension: Secondary | ICD-10-CM | POA: Diagnosis not present

## 2021-10-27 DIAGNOSIS — E1169 Type 2 diabetes mellitus with other specified complication: Secondary | ICD-10-CM

## 2021-10-27 DIAGNOSIS — F17200 Nicotine dependence, unspecified, uncomplicated: Secondary | ICD-10-CM

## 2021-10-27 LAB — POCT GLYCOSYLATED HEMOGLOBIN (HGB A1C): Hemoglobin A1C: 6.9 % — AB (ref 4.0–5.6)

## 2021-10-27 NOTE — Assessment & Plan Note (Addendum)
Improved with A1c of 6.9 down from 7 previously ?Plan to continue metformin XR 1000 mg daily which she tolerates well ?Continue ARB and statin ?Eye exam sent for per patient due this fall ?Good foot care ?Reviewed low glycemic diet and commended for work so far ?

## 2021-10-27 NOTE — Assessment & Plan Note (Signed)
Disc goals for lipids and reasons to control them ?Rev last labs with pt ?Rev low sat fat diet in detail ?Last LDL close to goal at 74 ?Plan to continue a atorvastatin 20 mg daily ?

## 2021-10-27 NOTE — Assessment & Plan Note (Signed)
bp in fair control at this time  ?BP Readings from Last 1 Encounters:  ?10/27/21 124/70  ? ?No changes needed ?Most recent labs reviewed  ?Disc lifstyle change with low sodium diet and exercise  ?Plan to continue ?Hydrochlorothiazide 25 mg daily ?Losartan 100 mg daily ?

## 2021-10-27 NOTE — Assessment & Plan Note (Signed)
Disc in detail risks of smoking and possible outcomes including copd, vascular/ heart disease, cancer , respiratory and sinus infections  ?Pt voices understanding ?Patient states she is not motivated or ready to quit at this time ?

## 2021-10-27 NOTE — Progress Notes (Signed)
? ?Subjective:  ? ? Patient ID: Kathleen Hernandez, female    DOB: [birth year], 66 y.o.   MRN: [id number] ? ?This visit occurred during the SARS-CoV-2 public health emergency.  Safety protocols were in place, including screening questions prior to the visit, additional usage of staff PPE, and extensive cleaning of exam room while observing appropriate contact time as indicated for disinfecting solutions.  ? ?HPI ?Pt presents for f/u of DM2 and chronic medical problems  ? ?Wt Readings from Last 3 Encounters:  ?10/27/21 222 lb 6 oz (100.9 kg)  ?05/20/21 225 lb (102.1 kg)  ?03/01/21 228 lb 8 oz (103.6 kg)  ? ?35.89 kg/m? ?Taking good care of herself  ? ? ?Smoking status : no changes ?Not ready to quit  ? ? ?HTN ?bp in fair control at this time  ?BP Readings from Last 3 Encounters:  ?10/27/21 124/70  ?03/01/21 124/78  ?02/03/21 128/62  ? ? ?No changes needed ?Most recent labs reviewed  ?Disc lifstyle change with low sodium diet and exercise  ? ?No exercise  ?Orthopedic issues are too difficult  ?Hctz 25 mg daily  ?Losartan 100 mg daily  ? ?DM2 ?Lab Results  ?Component Value Date  ? HGBA1C 7.0 (H) 06/09/2021  ? ?Due for a1c ? ?Lab Results  ?Component Value Date  ? HGBA1C 6.9 (A) 10/27/2021  ? ? ?Metformin xr 1000 mg daily  ? ?Arb and statin  ?Eye exam  per pt was in the fall  ?Foot care is good  ? ?Diet-still limiting sweets and simple carbs  ? ?Hyperlipidemia ?Lab Results  ?Component Value Date  ? CHOL 142 06/09/2021  ? HDL 43.20 06/09/2021  ? LDLCALC 74 06/09/2021  ? TRIG 123.0 06/09/2021  ? CHOLHDL 3 06/09/2021  ? ?Atorvastatin 20 mg daily  ?Diet - is stable  ? ? ?Patient Active Problem List  ? Diagnosis Date Noted  ? COVID-19 virus infection 05/20/2021  ? Low back pain 03/01/2021  ? 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 Leukocytosis 08/09/2020  ? Arrhythmia 08/09/2020  ? Rhinorrhea 07/21/2020  ? PVC (premature ventricular contraction) 07/21/2020  ? Knee pain, bilateral 02/10/2020  ? Mixed  incontinence urge and stress 02/10/2020  ? Encounter for general adult medical examination with abnormal findings 08/05/2019  ? Type 2 diabetes mellitus without complications (HCC) 08/23/2018  ? Hyperlipidemia associated with type 2 diabetes mellitus (HCC) 08/31/2017  ? Smoker 08/31/2017  ? Hypertension 08/31/2017  ? Encounter for screening mammogram for breast cancer 08/31/2017  ? Class 2 obesity due to excess calories with body mass index (BMI) of 35.0 to 35.9 in adult 08/31/2017  ? Depression with anxiety 08/31/2017  ? Insomnia 08/31/2017  ? ?Past Medical History:  ?Diagnosis Date  ? Depression   ? Diabetes mellitus without complication (HCC)   ? Family history of heart murmur   ? History of chickenpox   ? History of colon polyps   ? History of depression   ? History of gastroesophageal reflux (GERD)   ? History of hyperlipidemia   ? History of urinary incontinence   ? Hyperlipidemia   ? Hypertension   ? ?Past Surgical History:  ?Procedure Laterality Date  ? BREAST BIOPSY Right 2000  ? neg  ? BREAST REDUCTION SURGERY  2010  ? GALLBLADDER SURGERY  2009  ? INCONTINENCE SURGERY  2009  ? PARTIAL HYSTERECTOMY  2009  ? REDUCTION MAMMAPLASTY Bilateral 2000  ? ?Social History  ? ?Tobacco Use  ? Smoking status: Every Day  ?  Packs/day: 1.00  ?  Years: 41.00  ?  Pack years: 41.00  ?  Types: Cigarettes  ? Smokeless tobacco: Never  ?Substance Use Topics  ? Alcohol use: No  ? Drug use: No  ? ?Family History  ?Problem Relation Age of Onset  ? Arthritis Mother   ? Heart attack Mother   ? Early death Mother   ? Alcohol abuse Father   ? Alzheimer's disease Father   ? Non-Hodgkin's lymphoma Sister   ? Depression Sister   ? Alcohol abuse Brother   ? COPD Brother   ? Heart attack Brother   ? Hyperlipidemia Brother   ? Diabetes Maternal Grandmother   ? Alcohol abuse Maternal Grandfather   ? Alzheimer's disease Paternal Grandmother   ? Drug abuse Son   ? ?Allergies  ?Allergen Reactions  ? Requip [Ropinirole Hcl] Other (See Comments)   ?  Tongue swelling  ? Chantix [Varenicline] Rash  ? ?Current Outpatient Medications on File Prior to Visit  ?Medication Sig Dispense Refill  ? ACCU-CHEK FASTCLIX LANCETS MISC Check sugar once daily and as needed for diabetes. DX: E11.9 90 each 3  ? aspirin 325 MG tablet Take 325 mg by mouth daily.    ? atorvastatin (LIPITOR) 20 MG tablet TAKE 1 TABLET BY MOUTH EVERY DAY 90 tablet 1  ? Cholecalciferol (VITAMIN D3) 25 MCG (1000 UT) CAPS Take 1 capsule by mouth daily.    ? Cyanocobalamin ([employer] VITAMIN B-12) 2000 MCG TBCR Take 1 tablet by mouth daily.    ? EPINEPHrine (EPIPEN 2-PAK) 0.3 mg/0.3 mL IJ SOAJ injection Inject 0.3 mg into the muscle as needed for anaphylaxis. 1 each 0  ? glucose blood (ACCU-CHEK GUIDE) test strip USE TO CHECK BLOOD SUGAR ONCE DAILY AND AS NEEDED FOR DM (DX. E11.9) 100 strip 1  ? hydrochlorothiazide (HYDRODIURIL) 25 MG tablet TAKE 1 TABLET (25 MG TOTAL) BY MOUTH DAILY. 90 tablet 1  ? losartan (COZAAR) 100 MG tablet TAKE 1 TABLET BY MOUTH EVERY DAY 90 tablet 1  ? metFORMIN (GLUCOPHAGE XR) 500 MG 24 hr tablet Take 2 tablets (1,000 mg total) by mouth daily with breakfast. 180 tablet 3  ? Omega-3 Fatty Acids (FISH OIL) 1200 MG CAPS Take 1 capsule by mouth daily.    ? PARoxetine (PAXIL) 40 MG tablet TAKE 1 TABLET BY MOUTH EVERY DAY 90 tablet 1  ? vitamin E 400 UNIT capsule Take 400 Units by mouth daily.    ? zolpidem (AMBIEN) 10 MG tablet TAKE ONE TABLET BY MOUTH EVERY NIGHT AT BEDTIME AS NEEDED FOR SLEEP 30 tablet 3  ? ?No current facility-administered medications on file prior to visit.  ?  ? ?Review of Systems  ?Constitutional:  Negative for activity change, appetite change, fatigue, fever and unexpected weight change.  ?HENT:  Negative for congestion, ear pain, rhinorrhea, sinus pressure and sore throat.   ?Eyes:  Negative for pain, redness and visual disturbance.  ?Respiratory:  Negative for cough, shortness of breath and wheezing.   ?Cardiovascular:  Negative for chest pain and palpitations.   ?Gastrointestinal:  Negative for abdominal pain, blood in stool, constipation and diarrhea.  ?Endocrine: Negative for polydipsia and polyuria.  ?Genitourinary:  Negative for dysuria, frequency and urgency.  ?Musculoskeletal:  Positive for arthralgias and back pain. Negative for myalgias.  ?Skin:  Negative for pallor and rash.  ?Allergic/Immunologic: Negative for environmental allergies.  ?Neurological:  Negative for dizziness, syncope and headaches.  ?Hematological:  Negative for adenopathy. Does not bruise/bleed easily.  ?Psychiatric/Behavioral:  Negative for decreased concentration and dysphoric mood. The patient is not nervous/anxious.   ? ?   ?Objective:  ? Physical Exam ?Constitutional:   ?   General: She is not in acute distress. ?   Appearance: Normal appearance. She is well-developed. She is obese. She is not ill-appearing or diaphoretic.  ?HENT:  ?   Head: Normocephalic and atraumatic.  ?Eyes:  ?   Conjunctiva/sclera: Conjunctivae normal.  ?   Pupils: Pupils are equal, round, and reactive to light.  ?Neck:  ?   Thyroid: No thyromegaly.  ?   Vascular: No carotid bruit or JVD.  ?Cardiovascular:  ?   Rate and Rhythm: Normal rate and regular rhythm.  ?   Heart sounds: Normal heart sounds.  ?  No gallop.  ?Pulmonary:  ?   Effort: Pulmonary effort is normal. No respiratory distress.  ?   Breath sounds: Normal breath sounds. No wheezing or rales.  ?Abdominal:  ?   General: There is no distension or abdominal bruit.  ?   Palpations: Abdomen is soft.  ?Musculoskeletal:  ?   Cervical back: Normal range of motion and neck supple.  ?   Right lower leg: No edema.  ?   Left lower leg: No edema.  ?Lymphadenopathy:  ?   Cervical: No cervical adenopathy.  ?Skin: ?   General: Skin is warm and dry.  ?   Coloration: Skin is not pale.  ?   Findings: No rash.  ?Neurological:  ?   Mental Status: She is alert.  ?   Coordination: Coordination normal.  ?   Deep Tendon Reflexes: Reflexes are normal and symmetric. Reflexes normal.   ?Psychiatric:     ?   Mood and Affect: Mood normal.  ? ? ? ? ? ?   ?Assessment & Plan:  ? ?Problem List Items Addressed This Visit   ? ?  ? Cardiovascular and Mediastinum  ? Hypertension  ?  bp in fair control at

## 2021-10-27 NOTE — Assessment & Plan Note (Signed)
Discussed how this problem influences overall health and the risks it imposes  ?Reviewed plan for weight loss with lower calorie diet (via better food choices and also portion control or program like weight watchers) and exercise building up to or more than 30 minutes 5 days per week including some aerobic activity  ? ?Wt is down 3 lb today and 6 from last summer  ?

## 2021-10-27 NOTE — Patient Instructions (Addendum)
We will send for your eye exam  ? ?A1c is down to 6.9  ?Keep working on it  ? ?Try to get most of your carbohydrates from produce (with the exception of white potatoes)  ?Eat less bread/pasta/rice/snack foods/cereals/sweets and other items from the middle of the grocery store (processed carbs) ? ?Get as much exercise as your body will let you  ?Even very short walks, stretching, chair yoga is fine  ?Try to keep moving  ? ?Follow up for annual exam in 6 months  ? ? ? ?

## 2021-11-23 DIAGNOSIS — M170 Bilateral primary osteoarthritis of knee: Secondary | ICD-10-CM | POA: Diagnosis not present

## 2021-12-21 ENCOUNTER — Other Ambulatory Visit: Payer: Self-pay | Admitting: Family Medicine

## 2021-12-21 NOTE — Telephone Encounter (Signed)
Name of Medication: Ambien ?Name of Pharmacy: [facility] ?Last Fill or Written Date and Quantity: 05/06/21 #30 tabs with 3 refills ?Last Office Visit and Type: f/u 10/27/21 ?Next Office Visit and Type: CPE on 04/19/22 ?Last Controlled Substance Agreement Date: no UDS ?Last UDS: no UDS ?

## 2022-03-31 ENCOUNTER — Other Ambulatory Visit: Payer: Self-pay | Admitting: Family Medicine

## 2022-04-08 ENCOUNTER — Other Ambulatory Visit: Payer: Self-pay | Admitting: Family Medicine

## 2022-04-09 ENCOUNTER — Telehealth: Payer: Self-pay | Admitting: Family Medicine

## 2022-04-09 DIAGNOSIS — E1169 Type 2 diabetes mellitus with other specified complication: Secondary | ICD-10-CM

## 2022-04-09 DIAGNOSIS — E119 Type 2 diabetes mellitus without complications: Secondary | ICD-10-CM

## 2022-04-09 DIAGNOSIS — I10 Essential (primary) hypertension: Secondary | ICD-10-CM

## 2022-04-09 NOTE — Telephone Encounter (Signed)
-----   Message from Alvina Chou sent at 03/28/2022  4:07 PM EDT ----- Regarding: Lab orders for Monday 8.28.23 Patient is scheduled for CPX labs, please order future labs, Thanks , Camelia Eng

## 2022-04-10 ENCOUNTER — Other Ambulatory Visit (INDEPENDENT_AMBULATORY_CARE_PROVIDER_SITE_OTHER): Payer: Medicare HMO

## 2022-04-10 DIAGNOSIS — E785 Hyperlipidemia, unspecified: Secondary | ICD-10-CM | POA: Diagnosis not present

## 2022-04-10 DIAGNOSIS — E119 Type 2 diabetes mellitus without complications: Secondary | ICD-10-CM

## 2022-04-10 DIAGNOSIS — I10 Essential (primary) hypertension: Secondary | ICD-10-CM | POA: Diagnosis not present

## 2022-04-10 DIAGNOSIS — E1169 Type 2 diabetes mellitus with other specified complication: Secondary | ICD-10-CM

## 2022-04-10 LAB — CBC WITH DIFFERENTIAL/PLATELET
Basophils Absolute: 0.1 10*3/uL (ref 0.0–0.1)
Basophils Relative: 0.5 % (ref 0.0–3.0)
Eosinophils Absolute: 0.2 10*3/uL (ref 0.0–0.7)
Eosinophils Relative: 2 % (ref 0.0–5.0)
HCT: 44.8 % (ref 36.0–46.0)
Hemoglobin: 15 g/dL (ref 12.0–15.0)
Lymphocytes Relative: 24.4 % (ref 12.0–46.0)
Lymphs Abs: 2.5 10*3/uL (ref 0.7–4.0)
MCHC: 33.5 g/dL (ref 30.0–36.0)
MCV: 94.8 fl (ref 78.0–100.0)
Monocytes Absolute: 0.9 10*3/uL (ref 0.1–1.0)
Monocytes Relative: 9.3 % (ref 3.0–12.0)
Neutro Abs: 6.4 10*3/uL (ref 1.4–7.7)
Neutrophils Relative %: 63.8 % (ref 43.0–77.0)
Platelets: 326 10*3/uL (ref 150.0–400.0)
RBC: 4.73 Mil/uL (ref 3.87–5.11)
RDW: 12.6 % (ref 11.5–15.5)
WBC: 10.1 10*3/uL (ref 4.0–10.5)

## 2022-04-10 LAB — MICROALBUMIN / CREATININE URINE RATIO
Creatinine,U: 227.1 mg/dL
Microalb Creat Ratio: 1.3 mg/g (ref 0.0–30.0)
Microalb, Ur: 2.9 mg/dL — ABNORMAL HIGH (ref 0.0–1.9)

## 2022-04-10 LAB — COMPREHENSIVE METABOLIC PANEL
ALT: 10 U/L (ref 0–35)
AST: 11 U/L (ref 0–37)
Albumin: 4 g/dL (ref 3.5–5.2)
Alkaline Phosphatase: 104 U/L (ref 39–117)
BUN: 15 mg/dL (ref 6–23)
CO2: 32 mEq/L (ref 19–32)
Calcium: 9.1 mg/dL (ref 8.4–10.5)
Chloride: 100 mEq/L (ref 96–112)
Creatinine, Ser: 0.68 mg/dL (ref 0.40–1.20)
GFR: 91.03 mL/min (ref >60.00)
Glucose, Bld: 107 mg/dL — ABNORMAL HIGH (ref 70–99)
Potassium: 4.1 mEq/L (ref 3.5–5.1)
Sodium: 140 mEq/L (ref 135–145)
Total Bilirubin: 0.4 mg/dL (ref 0.2–1.2)
Total Protein: 6.5 g/dL (ref 6.0–8.3)

## 2022-04-10 LAB — TSH: TSH: 5.08 u[IU]/mL (ref 0.35–5.50)

## 2022-04-10 LAB — LIPID PANEL
Cholesterol: 143 mg/dL (ref 0–200)
HDL: 44.8 mg/dL (ref >39.00)
LDL Cholesterol: 74 mg/dL (ref 0–99)
NonHDL: 97.93
Total CHOL/HDL Ratio: 3
Triglycerides: 122 mg/dL (ref 0.0–149.0)
VLDL: 24.4 mg/dL (ref 0.0–40.0)

## 2022-04-10 LAB — HEMOGLOBIN A1C: Hgb A1c MFr Bld: 6.8 % — ABNORMAL HIGH (ref 4.6–6.5)

## 2022-04-19 ENCOUNTER — Encounter: Payer: Self-pay | Admitting: Family Medicine

## 2022-04-19 ENCOUNTER — Ambulatory Visit (INDEPENDENT_AMBULATORY_CARE_PROVIDER_SITE_OTHER): Payer: Medicare HMO | Admitting: Family Medicine

## 2022-04-19 VITALS — BP 126/74 | HR 65 | Temp 97.7°F | Ht 65.5 in | Wt 216.4 lb

## 2022-04-19 DIAGNOSIS — Z1231 Encounter for screening mammogram for malignant neoplasm of breast: Secondary | ICD-10-CM

## 2022-04-19 DIAGNOSIS — Z6835 Body mass index (BMI) 35.0-35.9, adult: Secondary | ICD-10-CM | POA: Diagnosis not present

## 2022-04-19 DIAGNOSIS — E1169 Type 2 diabetes mellitus with other specified complication: Secondary | ICD-10-CM | POA: Diagnosis not present

## 2022-04-19 DIAGNOSIS — E119 Type 2 diabetes mellitus without complications: Secondary | ICD-10-CM | POA: Diagnosis not present

## 2022-04-19 DIAGNOSIS — Z0000 Encounter for general adult medical examination without abnormal findings: Secondary | ICD-10-CM | POA: Diagnosis not present

## 2022-04-19 DIAGNOSIS — I10 Essential (primary) hypertension: Secondary | ICD-10-CM

## 2022-04-19 DIAGNOSIS — F418 Other specified anxiety disorders: Secondary | ICD-10-CM

## 2022-04-19 DIAGNOSIS — F17200 Nicotine dependence, unspecified, uncomplicated: Secondary | ICD-10-CM

## 2022-04-19 DIAGNOSIS — E2839 Other primary ovarian failure: Secondary | ICD-10-CM | POA: Diagnosis not present

## 2022-04-19 DIAGNOSIS — E785 Hyperlipidemia, unspecified: Secondary | ICD-10-CM

## 2022-04-19 DIAGNOSIS — R69 Illness, unspecified: Secondary | ICD-10-CM | POA: Diagnosis not present

## 2022-04-19 NOTE — Progress Notes (Signed)
Subjective:    Patient ID: Kathleen SanePatricia A Hernandez, female    DOB: 06/27/1956, 66 y.o.   MRN: [id number]  HPI Here for health maintenance exam and to review chronic medical problems    Wt Readings from Last 3 Encounters:  04/19/22 216 lb 6 oz (98.1 kg)  10/27/21 222 lb 6 oz (100.9 kg)  05/20/21 225 lb (102.1 kg)   35.46 kg/m  Feeling ok   Taking care of herself  Doing a lot of yard work for exercise   Immunization History  Administered Date(s) Administered   Fluad Quad(high Dose 65+) 04/29/2019   Influenza,inj,Quad PF,6+ Mos 08/23/2018   Influenza-Unspecified 05/28/2017   Pneumococcal Polysaccharide-23 10/08/2017   Tdap 10/08/2017   Health Maintenance Due  Topic Date Due   Zoster Vaccines- Shingrix (1 of 2) Never done   Pneumonia Vaccine 366+ Years old (2 - PCV) 10/08/2018   DEXA SCAN  Never done   OPHTHALMOLOGY EXAM  11/08/2021   INFLUENZA VACCINE  03/14/2022   Shingrix: declines   Flu shot: - declines   Pna 23 vaccine 2019   Dexa : interested  Falls: none  Fractures: none Supplements : vitamin D Exercise : yard work    Mood    04/19/2022    8:20 AM 11/24/2020   10:33 AM 02/10/2020    8:56 AM 02/10/2020    8:55 AM 08/05/2019    3:45 PM  Depression screen PHQ 2/9  Decreased Interest 0 1 0 0 2  Down, Depressed, Hopeless 0 1 0 0 2  PHQ - 2 Score 0 2 0 0 4  Altered sleeping  2 0  2  Tired, decreased energy  1 1  2   Change in appetite  0 1  0  Feeling bad or failure about yourself   0 0  1  Trouble concentrating  0 0  1  Moving slowly or fidgety/restless  0 0  0  Suicidal thoughts  0 0  0  PHQ-9 Score  5 2  10   Difficult doing work/chores  Somewhat difficult Not difficult at all  Somewhat difficult     Mammogram 10/2020- will schedule  Self breast exam:  no lumps   Colonoscopy 06/2013  Smoking status : 1 ppd  No plans to quit  Not motivated  Interested in lung cancer screening  1ppd since age 66    HTN bp is stable today  No cp or palpitations  or headaches or edema  No side effects to medicines  BP Readings from Last 3 Encounters:  04/19/22 126/74  10/27/21 124/70  03/01/21 124/78    Hctz 25 mg daily  Losartan 100 mg daily   DM2 Lab Results  Component Value Date   HGBA1C 6.8 (H) 04/10/2022   Stable  Metformin xr 1000 mg daily  No problems  No high or low numbers  Arb and statin  Eye exam: was last oct and has one scheduled - in November for DM eye exam  Some vision change   Lab Results  Component Value Date   MICROALBUR 2.9 (H) 04/10/2022      Lab Results  Component Value Date   WBC 10.1 04/10/2022   HGB 15.0 04/10/2022   HCT 44.8 04/10/2022   MCV 94.8 04/10/2022   PLT 326.0 04/10/2022   Lab Results  Component Value Date   CREATININE 0.68 04/10/2022   BUN 15 04/10/2022   NA 140 04/10/2022   K 4.1 04/10/2022   CL 100 04/10/2022  CO2 32 04/10/2022   Lab Results  Component Value Date   ALT 10 04/10/2022   AST 11 04/10/2022   ALKPHOS 104 04/10/2022   BILITOT 0.4 04/10/2022   Lab Results  Component Value Date   TSH 5.08 04/10/2022    Hyperlipidemia Lab Results  Component Value Date   CHOL 143 04/10/2022   CHOL 142 06/09/2021   CHOL 159 08/02/2020   Lab Results  Component Value Date   HDL 44.80 04/10/2022   HDL 43.20 06/09/2021   HDL 47.50 08/02/2020   Lab Results  Component Value Date   LDLCALC 74 04/10/2022   LDLCALC 74 06/09/2021   LDLCALC 82 08/02/2020   Lab Results  Component Value Date   TRIG 122.0 04/10/2022   TRIG 123.0 06/09/2021   TRIG 147.0 08/02/2020   Lab Results  Component Value Date   CHOLHDL 3 04/10/2022   CHOLHDL 3 06/09/2021   CHOLHDL 3 08/02/2020   No results found for: "LDLDIRECT"  Stable- with atorvastatin 20 mg daily   Patient Active Problem List   Diagnosis Date Noted   Routine general medical examination at a health care facility 04/19/2022   Estrogen deficiency 04/19/2022   Low back pain 03/01/2021   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   Leukocytosis 08/09/2020   Rhinorrhea 07/21/2020   PVC (premature ventricular contraction) 07/21/2020   Knee pain, bilateral 02/10/2020   Mixed incontinence urge and stress 02/10/2020   Encounter for general adult medical examination with abnormal findings 08/05/2019   Type 2 diabetes mellitus without complications (HCC) 08/23/2018   Hyperlipidemia associated with type 2 diabetes mellitus (HCC) 08/31/2017   Smoker 08/31/2017   Hypertension 08/31/2017   Encounter for screening mammogram for breast cancer 08/31/2017   Class 2 obesity due to excess calories with body mass index (BMI) of 35.0 to 35.9 in adult 08/31/2017   Depression with anxiety 08/31/2017   Insomnia 08/31/2017   Past Medical History:  Diagnosis Date   Depression    Diabetes mellitus without complication (HCC)    Family history of heart murmur    History of chickenpox    History of colon polyps    History of depression    History of gastroesophageal reflux (GERD)    History of hyperlipidemia    History of urinary incontinence    Hyperlipidemia    Hypertension    Past Surgical History:  Procedure Laterality Date   BREAST BIOPSY Right 2000   neg   BREAST REDUCTION SURGERY  2010   GALLBLADDER SURGERY  2009   INCONTINENCE SURGERY  2009   PARTIAL HYSTERECTOMY  2009   REDUCTION MAMMAPLASTY Bilateral 2000   Social History   Tobacco Use   Smoking status: Every Day    Packs/day: 1.00    Years: 41.00    Total pack years: 41.00    Types: Cigarettes   Smokeless tobacco: Never  Substance Use Topics   Alcohol use: No   Drug use: No   Family History  Problem Relation Age of Onset   Arthritis Mother    Heart attack Mother    Early death Mother    Alcohol abuse Father    Alzheimer's disease Father    Non-Hodgkin's lymphoma Sister    Depression Sister    Alcohol abuse Brother    COPD Brother    Heart attack Brother    Hyperlipidemia Brother    Diabetes Maternal Grandmother     Alcohol abuse Maternal Grandfather    Alzheimer's disease Paternal  Grandmother    Drug abuse Son    Allergies  Allergen Reactions   Requip [Ropinirole Hcl] Other (See Comments)    Tongue swelling   Chantix [Varenicline] Rash   Current Outpatient Medications on File Prior to Visit  Medication Sig Dispense Refill   ACCU-CHEK FASTCLIX LANCETS MISC Check sugar once daily and as needed for diabetes. DX: E11.9 90 each 3   aspirin 325 MG tablet Take 325 mg by mouth daily.     atorvastatin (LIPITOR) 20 MG tablet TAKE 1 TABLET BY MOUTH EVERY DAY 90 tablet 1   Cholecalciferol (VITAMIN D3) 25 MCG (1000 UT) CAPS Take 1 capsule by mouth daily.     EPINEPHrine (EPIPEN 2-PAK) 0.3 mg/0.3 mL IJ SOAJ injection Inject 0.3 mg into the muscle as needed for anaphylaxis. 1 each 0   glucose blood (ACCU-CHEK GUIDE) test strip USE TO CHECK BLOOD SUGAR ONCE DAILY AND AS NEEDED FOR DM (DX. E11.9) 100 strip 1   hydrochlorothiazide (HYDRODIURIL) 25 MG tablet TAKE 1 TABLET (25 MG TOTAL) BY MOUTH DAILY. 90 tablet 1   losartan (COZAAR) 100 MG tablet TAKE 1 TABLET BY MOUTH EVERY DAY 90 tablet 1   metFORMIN (GLUCOPHAGE-XR) 500 MG 24 hr tablet TAKE 2 TABLETS BY MOUTH EVERY DAY WITH BREAKFAST 180 tablet 0   Omega-3 Fatty Acids (FISH OIL) 1200 MG CAPS Take 1 capsule by mouth daily.     PARoxetine (PAXIL) 40 MG tablet TAKE 1 TABLET BY MOUTH EVERY DAY 90 tablet 1   vitamin E 400 UNIT capsule Take 400 Units by mouth daily.     zolpidem (AMBIEN) 10 MG tablet TAKE ONE TABLET BY MOUTH EVERY NIGHT AT BEDTIME AS NEEDED FOR SLEEP 30 tablet 3   No current facility-administered medications on file prior to visit.    Review of Systems  Constitutional:  Negative for activity change, appetite change, fatigue, fever and unexpected weight change.  HENT:  Negative for congestion, ear pain, rhinorrhea, sinus pressure and sore throat.   Eyes:  Negative for pain, redness and visual disturbance.  Respiratory:  Negative for cough, shortness of  breath and wheezing.   Cardiovascular:  Negative for chest pain and palpitations.  Gastrointestinal:  Negative for abdominal pain, blood in stool, constipation and diarrhea.  Endocrine: Negative for polydipsia and polyuria.  Genitourinary:  Negative for dysuria, frequency and urgency.  Musculoskeletal:  Negative for arthralgias, back pain and myalgias.  Skin:  Negative for pallor and rash.  Allergic/Immunologic: Negative for environmental allergies.  Neurological:  Negative for dizziness, syncope and headaches.  Hematological:  Negative for adenopathy. Does not bruise/bleed easily.  Psychiatric/Behavioral:  Negative for decreased concentration and dysphoric mood. The patient is not nervous/anxious.        Objective:   Physical Exam Constitutional:      General: She is not in acute distress.    Appearance: Normal appearance. She is well-developed. She is obese. She is not ill-appearing or diaphoretic.  HENT:     Head: Normocephalic and atraumatic.     Right Ear: Tympanic membrane, ear canal and external ear normal. There is no impacted cerumen.     Left Ear: Tympanic membrane, ear canal and external ear normal. There is no impacted cerumen.     Nose: Nose normal. No congestion.     Mouth/Throat:     Mouth: Mucous membranes are moist.     Pharynx: Oropharynx is clear. No posterior oropharyngeal erythema.  Eyes:     General: No scleral icterus.  Extraocular Movements: Extraocular movements intact.     Conjunctiva/sclera: Conjunctivae normal.     Pupils: Pupils are equal, round, and reactive to light.  Neck:     Thyroid: No thyromegaly.     Vascular: No carotid bruit or JVD.  Cardiovascular:     Rate and Rhythm: Normal rate and regular rhythm.     Pulses: Normal pulses.     Heart sounds: Normal heart sounds.     No gallop.  Pulmonary:     Effort: Pulmonary effort is normal. No respiratory distress.     Breath sounds: Normal breath sounds. No wheezing.     Comments: Diffusely  distant bs Scant end exp wheeze -scattered  Chest:     Chest wall: No tenderness.  Abdominal:     General: Bowel sounds are normal. There is no distension or abdominal bruit.     Palpations: Abdomen is soft. There is no mass.     Tenderness: There is no abdominal tenderness.     Hernia: No hernia is present.  Genitourinary:    Comments: Breast exam: No mass, nodules, thickening, tenderness, bulging, retraction, inflamation, nipple discharge or skin changes noted.  No axillary or clavicular LA.     Musculoskeletal:        General: No tenderness. Normal range of motion.     Cervical back: Normal range of motion and neck supple. No rigidity. No muscular tenderness.     Right lower leg: No edema.     Left lower leg: No edema.     Comments: No kyphosis   Lymphadenopathy:     Cervical: No cervical adenopathy.  Skin:    General: Skin is warm and dry.     Coloration: Skin is not pale.     Findings: No erythema or rash.     Comments: Tanned Solar lentigines diffusely   Neurological:     Mental Status: She is alert. Mental status is at baseline.     Cranial Nerves: No cranial nerve deficit.     Motor: No abnormal muscle tone.     Coordination: Coordination normal.     Gait: Gait normal.     Deep Tendon Reflexes: Reflexes are normal and symmetric. Reflexes normal.  Psychiatric:        Mood and Affect: Mood normal.        Cognition and Memory: Cognition and memory normal.     Comments: Pleasant            Assessment & Plan:   Problem List Items Addressed This Visit       Cardiovascular and Mediastinum   Hypertension    bp in fair control at this time  BP Readings from Last 1 Encounters:  04/19/22 126/74  No changes needed Most recent labs reviewed  Disc lifstyle change with low sodium diet and exercise  Plan to continue hctz 25 mg daily  Losartan 100 mg daily          Endocrine   Hyperlipidemia associated with type 2 diabetes mellitus (HCC)    Disc goals for  lipids and reasons to control them Rev last labs with pt Rev low sat fat diet in detail  LDL of 74 close ot goal Plan to continue atorvastatin 20 mg daily        Type 2 diabetes mellitus without complications (HCC)    Lab Results  Component Value Date   HGBA1C 6.8 (H) 04/10/2022  This is stable Plan to continue metformin XR 1000 mg daily  Enc low glycemic diet Taking arb and statin  Eye exam planned in November  microalb ratio is normal  F/u in 6 mo         Other   Class 2 obesity due to excess calories with body mass index (BMI) of 35.0 to 35.9 in adult    Discussed how this problem influences overall health and the risks it imposes  Reviewed plan for weight loss with lower calorie diet (via better food choices and also portion control or program like weight watchers) and exercise building up to or more than 30 minutes 5 days per week including some aerobic activity         Depression with anxiety    Mood is stable Continues paxil 40 mg daily  Wants to continue it  ambien prn for sleep      Encounter for screening mammogram for breast cancer    Mammogram ordered Pt will call to schedule Enc to do self exams       Relevant Orders   MM 3D SCREEN BREAST BILATERAL   Estrogen deficiency   Relevant Orders   DG Bone Density   Routine general medical examination at a health care facility - Primary    Reviewed health habits including diet and exercise and skin cancer prevention Reviewed appropriate screening tests for age  Also reviewed health mt list, fam hx and immunization status , as well as social and family history   See HPI Labs reviewed  Declines shingrix and pna vaccine and flu shot  dexa and mammogram ordered, pt will call to schedule No falls or fractures, taking vit D phq of 0 colonosocpy utd 06/2013 Pt is not interested in smoking cessation (counseled)       Smoker    Disc in detail risks of smoking and possible outcomes including copd, vascular/  heart disease, cancer , respiratory and sinus infections  Pt voices understanding Pt is not interested in quitting Counseled  Disc lung cancer screening with CT  Pt is interested-order placed       Relevant Orders   Ambulatory Referral Lung Cancer Screening [facility]

## 2022-04-19 NOTE — Patient Instructions (Addendum)
Keep spots on legs clean with soap and water Antibiotic ointment is helpful as well  Wear protection outdoors - knee socks boots and pants   I will put in a referral for lung cancer screening program  You will get a call   If you change your mind about immunizations let us know   Use sun protection when you are out   Think about quitting smoking   Follow up in 6 months    Call [facility] and schedule mammogram and dexa   Please call the location of your choice from the menu below to schedule your Mammogram and/or Bone Density appointment.    [facility]   [facility]                      Phone:  [phone] [address]                               [place], [place] [address]                                                             Services: Traditional and 3D Mammogram, Bone Density   [facility]                 Phone: [phone] [address]                                                       [place], [place] [address]    Service: Bone Density ONLY   *this site does NOT perform mammograms  [facility]                        Phone:  [phone] [address]                                  [place], [place] [address]                                            Services:  3D Mammogram and Bone Density    [facility]   Phone:  [phone]   [address]                                                                            [place], [place] [address]  Services: 3D Mammogram and Bone Density  [facility] [place] ([facility])  Phone:  [phone]   [address]                        [place], [place] [address]                                              Services:  3D Mammogram and Bone Density  

## 2022-04-19 NOTE — Assessment & Plan Note (Signed)
Lab Results  Component Value Date   HGBA1C 6.8 (H) 04/10/2022   This is stable Plan to continue metformin XR 1000 mg daily  Enc low glycemic diet Taking arb and statin  Eye exam planned in November  microalb ratio is normal  F/u in 6 mo

## 2022-04-19 NOTE — Assessment & Plan Note (Signed)
Mood is stable Continues paxil 40 mg daily  Wants to continue it  ambien prn for sleep

## 2022-04-19 NOTE — Assessment & Plan Note (Signed)
bp in fair control at this time  BP Readings from Last 1 Encounters:  04/19/22 126/74   No changes needed Most recent labs reviewed  Disc lifstyle change with low sodium diet and exercise  Plan to continue hctz 25 mg daily  Losartan 100 mg daily

## 2022-04-19 NOTE — Assessment & Plan Note (Signed)
Reviewed health habits including diet and exercise and skin cancer prevention Reviewed appropriate screening tests for age  Also reviewed health mt list, fam hx and immunization status , as well as social and family history   See HPI Labs reviewed  Declines shingrix and pna vaccine and flu shot  dexa and mammogram ordered, pt will call to schedule No falls or fractures, taking vit D phq of 0 colonosocpy utd 06/2013 Pt is not interested in smoking cessation (counseled)

## 2022-04-19 NOTE — Assessment & Plan Note (Signed)
Discussed how this problem influences overall health and the risks it imposes  Reviewed plan for weight loss with lower calorie diet (via better food choices and also portion control or program like weight watchers) and exercise building up to or more than 30 minutes 5 days per week including some aerobic activity    

## 2022-04-19 NOTE — Assessment & Plan Note (Signed)
Disc goals for lipids and reasons to control them Rev last labs with pt Rev low sat fat diet in detail  LDL of 74 close ot goal Plan to continue atorvastatin 20 mg daily   

## 2022-04-19 NOTE — Assessment & Plan Note (Signed)
Mammogram ordered Pt will call to schedule Enc to do self exams

## 2022-04-19 NOTE — Assessment & Plan Note (Addendum)
Disc in detail risks of smoking and possible outcomes including copd, vascular/ heart disease, cancer , respiratory and sinus infections  Pt voices understanding Pt is not interested in quitting Counseled  Disc lung cancer screening with CT  Pt is interested-order placed

## 2022-05-26 ENCOUNTER — Ambulatory Visit (INDEPENDENT_AMBULATORY_CARE_PROVIDER_SITE_OTHER): Payer: Medicare HMO

## 2022-05-26 VITALS — Ht 65.5 in | Wt 216.0 lb

## 2022-05-26 DIAGNOSIS — Z0000 Encounter for general adult medical examination without abnormal findings: Secondary | ICD-10-CM | POA: Diagnosis not present

## 2022-05-26 NOTE — Progress Notes (Signed)
Subjective:   Kathleen Hernandez is a 66 y.o. female who presents for Medicare Annual (Subsequent) preventive examination.  Review of Systems    Virtual Visit via Telephone Note  I connected with  [name] on 05/26/22 at  8:15 AM EDT by telephone and verified that I am speaking with the correct person using two identifiers.  Location: Patient: Home Provider: Office Persons participating in the virtual visit: patient/Nurse Health Advisor   I discussed the limitations, risks, security and privacy concerns of performing an evaluation and management service by telephone and the availability of in person appointments. The patient expressed understanding and agreed to proceed.  Interactive audio and video telecommunications were attempted between this nurse and patient, however failed, due to patient having technical difficulties OR patient did not have access to video capability.  We continued and completed visit with audio only.  Some vital signs may be absent or patient reported.   Criselda Peaches, LPN  Cardiac Risk Factors include: advanced age (>70men, >79 women);diabetes mellitus;hypertension     Objective:    Today's Vitals   05/26/22 0821  Weight: 216 lb (98 kg)  Height: 5' 5.5" (1.664 m)   Body mass index is 35.4 kg/m.     05/26/2022    8:29 AM 09/11/2018    9:09 AM 09/16/2017   12:48 PM  Advanced Directives  Does Patient Have a Medical Advance Directive? No No No  Would patient like information on creating a medical advance directive? No - Patient declined No - Patient declined No - Patient declined    Current Medications (verified) Outpatient Encounter Medications as of 05/26/2022  Medication Sig   ACCU-CHEK FASTCLIX LANCETS MISC Check sugar once daily and as needed for diabetes. DX: E11.9   aspirin 325 MG tablet Take 325 mg by mouth daily.   atorvastatin (LIPITOR) 20 MG tablet TAKE 1 TABLET BY MOUTH EVERY DAY   Cholecalciferol (VITAMIN D3) 25 MCG  (1000 UT) CAPS Take 1 capsule by mouth daily.   EPINEPHrine (EPIPEN 2-PAK) 0.3 mg/0.3 mL IJ SOAJ injection Inject 0.3 mg into the muscle as needed for anaphylaxis.   glucose blood (ACCU-CHEK GUIDE) test strip USE TO CHECK BLOOD SUGAR ONCE DAILY AND AS NEEDED FOR DM (DX. E11.9)   hydrochlorothiazide (HYDRODIURIL) 25 MG tablet TAKE 1 TABLET (25 MG TOTAL) BY MOUTH DAILY.   losartan (COZAAR) 100 MG tablet TAKE 1 TABLET BY MOUTH EVERY DAY   metFORMIN (GLUCOPHAGE-XR) 500 MG 24 hr tablet TAKE 2 TABLETS BY MOUTH EVERY DAY WITH BREAKFAST   Omega-3 Fatty Acids (FISH OIL) 1200 MG CAPS Take 1 capsule by mouth daily.   PARoxetine (PAXIL) 40 MG tablet TAKE 1 TABLET BY MOUTH EVERY DAY   vitamin E 400 UNIT capsule Take 400 Units by mouth daily.   zolpidem (AMBIEN) 10 MG tablet TAKE ONE TABLET BY MOUTH EVERY NIGHT AT BEDTIME AS NEEDED FOR SLEEP   No facility-administered encounter medications on file as of 05/26/2022.    Allergies (verified) Requip [ropinirole hcl] and Chantix [varenicline]   History: Past Medical History:  Diagnosis Date   Depression    Diabetes mellitus without complication ([place])    Family history of heart murmur    History of chickenpox    History of colon polyps    History of depression    History of gastroesophageal reflux (GERD)    History of hyperlipidemia    History of urinary incontinence    Hyperlipidemia    Hypertension  Past Surgical History:  Procedure Laterality Date   BREAST BIOPSY Right 2000   neg   BREAST REDUCTION SURGERY  2010   GALLBLADDER SURGERY  2009   INCONTINENCE SURGERY  2009   PARTIAL HYSTERECTOMY  2009   REDUCTION MAMMAPLASTY Bilateral 2000   Family History  Problem Relation Age of Onset   Arthritis Mother    Heart attack Mother    Early death Mother    Alcohol abuse Father    Alzheimer's disease Father    Non-Hodgkin's lymphoma Sister    Depression Sister    Alcohol abuse Brother    COPD Brother    Heart attack Brother     Hyperlipidemia Brother    Diabetes Maternal Grandmother    Alcohol abuse Maternal Grandfather    Alzheimer's disease Paternal Grandmother    Drug abuse Son    Social History   Socioeconomic History   Marital status: Widowed    Spouse name: Not on file   Number of children: Not on file   Years of education: Not on file   Highest education level: Not on file  Occupational History   Not on file  Tobacco Use   Smoking status: Every Day    Packs/day: 1.00    Years: 41.00    Total pack years: 41.00    Types: Cigarettes   Smokeless tobacco: Never  Substance and Sexual Activity   Alcohol use: No   Drug use: No   Sexual activity: Not on file  Other Topics Concern   Not on file  Social History Narrative   Not on file   Social Determinants of Health   Financial Resource Strain: Low Risk  (05/26/2022)   Overall Financial Resource Strain (CARDIA)    Difficulty of Paying Living Expenses: Not hard at all  Food Insecurity: No Food Insecurity (05/26/2022)   Hunger Vital Sign    Worried About Running Out of Food in the Last Year: Never true    Ran Out of Food in the Last Year: Never true  Transportation Needs: No Transportation Needs (05/26/2022)   PRAPARE - Administrator, Civil Service (Medical): No    Lack of Transportation (Non-Medical): No  Physical Activity: Inactive (05/26/2022)   Exercise Vital Sign    Days of Exercise per Week: 0 days    Minutes of Exercise per Session: 0 min  Stress: No Stress Concern Present (05/26/2022)   [employer] of Occupational Health - Occupational Stress Questionnaire    Feeling of Stress : Not at all  Social Connections: Socially Isolated (05/26/2022)   Social Connection and Isolation Panel [NHANES]    Frequency of Communication with Friends and Family: More than three times a week    Frequency of Social Gatherings with Friends and Family: More than three times a week    Attends Religious Services: Never    Doctor, general practice or Organizations: No    Attends Banker Meetings: Never    Marital Status: Widowed    Tobacco Counseling Ready to quit: No Counseling given: Yes   Clinical Intake:  Pre-visit preparation completed: No  Pain : No/denies pain     BMI - recorded: 35.4 Nutritional Status: BMI > 30  Obese Nutritional Risks: None Diabetes: Yes CBG done?: No Did pt. bring in CBG monitor from home?: No  How often do you need to have someone help you when you read instructions, pamphlets, or other written materials from your doctor or pharmacy?: 1 -  Never  Diabetic?  Yes Controlled  Interpreter Needed?: No  Information entered by :: Rolene Arbour LPN   Activities of Daily Living    05/26/2022    8:26 AM  In your present state of health, do you have any difficulty performing the following activities:  Hearing? 0  Vision? 0  Difficulty concentrating or making decisions? 0  Walking or climbing stairs? 0  Dressing or bathing? 0  Doing errands, shopping? 0  Preparing Food and eating ? N  Using the Toilet? N  In the past six months, have you accidently leaked urine? Y  Comment Wears pads. Followed by PCP  Do you have problems with loss of bowel control? Y  Comment Wears pads Followed by PCP  Managing your Medications? N  Managing your Finances? N  Housekeeping or managing your Housekeeping? N    Patient Care Team: Tower, Wynelle Fanny, MD as PCP - General (Family Medicine)  Indicate any recent Medical Services you may have received from other than [facility] providers in the past year (date may be approximate).     Assessment:   This is a routine wellness examination for Jaydalynn.  Hearing/Vision screen Hearing Screening - Comments:: Denies hearing difficulties   Vision Screening - Comments:: Wears rx glasses - up to date with routine eye exams with  [facility]  Dietary issues and exercise activities discussed: Current Exercise Habits: The patient does not participate  in regular exercise at present, Exercise limited by: None identified   Goals Addressed               This Visit's Progress     No current goals (pt-stated)         Depression Screen    05/26/2022    8:25 AM 04/19/2022    8:20 AM 11/24/2020   10:33 AM 02/10/2020    8:56 AM 02/10/2020    8:55 AM 08/05/2019    3:45 PM 11/25/2018   11:43 AM  PHQ 2/9 Scores  PHQ - 2 Score 0 0 2 0 0 4 1  PHQ- 9 Score   5 2  10      Fall Risk    05/26/2022    8:29 AM 04/19/2022    8:20 AM 09/26/2018    9:26 AM 09/11/2018    9:09 AM  Fall Risk   Falls in the past year? 0 0 0 0  Number falls in past yr: 0     Injury with Fall? 0     Risk for fall due to : No Fall Risks     Follow up Falls prevention discussed Falls evaluation completed      [place]:  Any stairs in or around the home? Yes  If so, are there any without handrails? No  Home free of loose throw rugs in walkways, pet beds, electrical cords, etc? Yes  Adequate lighting in your home to reduce risk of falls? Yes   ASSISTIVE DEVICES UTILIZED TO PREVENT FALLS:  Life alert? No  Use of a cane, walker or w/c? No  Grab bars in the bathroom? Yes  Shower chair or bench in shower? Yes  Elevated toilet seat or a handicapped toilet? No   TIMED UP AND GO:  Was the test performed? No . Audio Visit   Cognitive Function:        05/26/2022    8:29 AM  6CIT Screen  What Year? 0 points  What month? 0 points  What  time? 0 points  Count back from 20 0 points  Months in reverse 0 points  Repeat phrase 0 points  Total Score 0 points    Immunizations Immunization History  Administered Date(s) Administered   Fluad Quad(high Dose 65+) 04/29/2019   Influenza,inj,Quad PF,6+ Mos 08/23/2018   Influenza-Unspecified 05/28/2017   Pneumococcal Polysaccharide-23 10/08/2017   Tdap 10/08/2017    TDAP status: Up to date  Flu Vaccine status: Declined, Education has been provided regarding the importance of  this vaccine but patient still declined. Advised may receive this vaccine at local pharmacy or Health Dept. Aware to provide a copy of the vaccination record if obtained from local pharmacy or Health Dept. Verbalized acceptance and understanding.  Pneumococcal vaccine status: Declined,  Education has been provided regarding the importance of this vaccine but patient still declined. Advised may receive this vaccine at local pharmacy or Health Dept. Aware to provide a copy of the vaccination record if obtained from local pharmacy or Health Dept. Verbalized acceptance and understanding.   Covid-19 vaccine status: Declined, Education has been provided regarding the importance of this vaccine but patient still declined. Advised may receive this vaccine at local pharmacy or Health Dept.or vaccine clinic. Aware to provide a copy of the vaccination record if obtained from local pharmacy or Health Dept. Verbalized acceptance and understanding.  Qualifies for Shingles Vaccine? Yes   Zostavax completed No   Shingrix Completed?: No.    Education has been provided regarding the importance of this vaccine. Patient has been advised to call insurance company to determine out of pocket expense if they have not yet received this vaccine. Advised may also receive vaccine at local pharmacy or Health Dept. Verbalized acceptance and understanding.  Screening Tests Health Maintenance  Topic Date Due   DEXA SCAN  Never done   OPHTHALMOLOGY EXAM  05/14/2022   Zoster Vaccines- Shingrix (1 of 2) 07/19/2022 (Originally 04/14/2006)   INFLUENZA VACCINE  11/12/2022 (Originally 03/14/2022)   Pneumonia Vaccine 23+ Years old (2 - PCV) 04/20/2023 (Originally 10/08/2018)   COVID-19 Vaccine (1) 03/17/2024 (Originally 10/12/1956)   Hepatitis C Screening  10/09/2027 (Originally [birth year]1974)   HEMOGLOBIN A1C  10/11/2022   MAMMOGRAM  10/19/2022   FOOT EXAM  10/28/2022   Diabetic kidney evaluation - GFR measurement  04/11/2023   Diabetic  kidney evaluation - Urine ACR  04/11/2023   COLONOSCOPY (Pts 45-58yrs Insurance coverage will need to be confirmed)  06/21/2023   TETANUS/TDAP  10/09/2027   HPV VACCINES  Aged Out    Health Maintenance  Health Maintenance Due  Topic Date Due   DEXA SCAN  Never done   OPHTHALMOLOGY EXAM  05/14/2022    Colorectal cancer screening: Type of screening: Colonoscopy. Completed 06/20/13. Repeat every 10 years  Mammogram status: Ordered Scheduled 05/30/22. Pt provided with contact info and advised to call to schedule appt.   Bone Density status: Ordered Scheduled 05/30/22. Pt provided with contact info and advised to call to schedule appt.  Lung Cancer Screening: (Low Dose CT Chest recommended if Age 74-80 years, 30 pack-year currently smoking OR have quit w/in 15years.) does qualify.   Lung Cancer Screening Referral: Patient deferred  Additional Screening:  Hepatitis C Screening: does qualify; Completed Patient deferred  Vision Screening: Recommended annual ophthalmology exams for early detection of glaucoma and other disorders of the eye. Is the patient up to date with their annual eye exam?  Yes  Who is the provider or what is the name of the office in which the  patient attends annual eye exams? [facility] If pt is not established with a provider, would they like to be referred to a provider to establish care? No .   Dental Screening: Recommended annual dental exams for proper oral hygiene  Community Resource Referral / Chronic Care Management:  CRR required this visit?  No   CCM required this visit?  No      Plan:     I have personally reviewed and noted the following in the patient's chart:   Medical and social history Use of alcohol, tobacco or illicit drugs  Current medications and supplements including opioid prescriptions. Patient is not currently taking opioid prescriptions. Functional ability and status Nutritional status Physical activity Advanced  directives List of other physicians Hospitalizations, surgeries, and ER visits in previous 12 months Vitals Screenings to include cognitive, depression, and falls Referrals and appointments  In addition, I have reviewed and discussed with patient certain preventive protocols, quality metrics, and best practice recommendations. A written personalized care plan for preventive services as well as general preventive health recommendations were provided to patient.     Criselda Peaches, LPN   [id number]   Nurse Notes: None

## 2022-05-26 NOTE — Patient Instructions (Addendum)
Kathleen Hernandez , Thank you for taking time to come for your Medicare Wellness Visit. I appreciate your ongoing commitment to your health goals. Please review the following plan we discussed and let me know if I can assist you in the future.   These are the goals we discussed:  Goals       No current goals (pt-stated)        This is a list of the screening recommended for you and due dates:  Health Maintenance  Topic Date Due   DEXA scan (bone density measurement)  Never done   Eye exam for diabetics  05/14/2022   Zoster (Shingles) Vaccine (1 of 2) 07/19/2022*   Flu Shot  11/12/2022*   Pneumonia Vaccine (2 - PCV) 04/20/2023*   COVID-19 Vaccine (1) 03/17/2024*   Hepatitis C Screening: USPSTF Recommendation to screen - Ages 18-79 yo.  10/09/2027*   Hemoglobin A1C  10/11/2022   Mammogram  10/19/2022   Complete foot exam   10/28/2022   Yearly kidney function blood test for diabetes  04/11/2023   Yearly kidney health urinalysis for diabetes  04/11/2023   Colon Cancer Screening  06/21/2023   Tetanus Vaccine  10/09/2027   HPV Vaccine  Aged Out  *Topic was postponed. The date shown is not the original due date.    Advanced directives: Advance directive discussed with you today. Even though you declined this today, please call our office should you change your mind, and we can give you the proper paperwork for you to fill out.   Conditions/risks identified: None  Next appointment: Follow up in one year for your annual wellness visit     Preventive Care 65 Years and Older, Female Preventive care refers to lifestyle choices and visits with your health care provider that can promote health and wellness. What does preventive care include? A yearly physical exam. This is also called an annual well check. Dental exams once or twice a year. Routine eye exams. Ask your health care provider how often you should have your eyes checked. Personal lifestyle choices, including: Daily care of your  teeth and gums. Regular physical activity. Eating a healthy diet. Avoiding tobacco and drug use. Limiting alcohol use. Practicing safe sex. Taking low-dose aspirin every day. Taking vitamin and mineral supplements as recommended by your health care provider. What happens during an annual well check? The services and screenings done by your health care provider during your annual well check will depend on your age, overall health, lifestyle risk factors, and family history of disease. Counseling  Your health care provider may ask you questions about your: Alcohol use. Tobacco use. Drug use. Emotional well-being. Home and relationship well-being. Sexual activity. Eating habits. History of falls. Memory and ability to understand (cognition). Work and work Statistician. Reproductive health. Screening  You may have the following tests or measurements: Height, weight, and BMI. Blood pressure. Lipid and cholesterol levels. These may be checked every 5 years, or more frequently if you are over 25 years old. Skin check. Lung cancer screening. You may have this screening every year starting at age 6 if you have a 30-pack-year history of smoking and currently smoke or have quit within the past 15 years. Fecal occult blood test (FOBT) of the stool. You may have this test every year starting at age 3. Flexible sigmoidoscopy or colonoscopy. You may have a sigmoidoscopy every 5 years or a colonoscopy every 10 years starting at age 38. Hepatitis C blood test. Hepatitis B blood test.  Sexually transmitted disease (STD) testing. Diabetes screening. This is done by checking your blood sugar (glucose) after you have not eaten for a while (fasting). You may have this done every 1-3 years. Bone density scan. This is done to screen for osteoporosis. You may have this done starting at age 42. Mammogram. This may be done every 1-2 years. Talk to your health care provider about how often you should have  regular mammograms. Talk with your health care provider about your test results, treatment options, and if necessary, the need for more tests. Vaccines  Your health care provider may recommend certain vaccines, such as: Influenza vaccine. This is recommended every year. Tetanus, diphtheria, and acellular pertussis (Tdap, Td) vaccine. You may need a Td booster every 10 years. Zoster vaccine. You may need this after age 72. Pneumococcal 13-valent conjugate (PCV13) vaccine. One dose is recommended after age 57. Pneumococcal polysaccharide (PPSV23) vaccine. One dose is recommended after age 40. Talk to your health care provider about which screenings and vaccines you need and how often you need them. This information is not intended to replace advice given to you by your health care provider. Make sure you discuss any questions you have with your health care provider. Document Released: 08/27/2015 Document Revised: 04/19/2016 Document Reviewed: 06/01/2015 Elsevier Interactive Patient Education  2017 [employer] Prevention in the Home Falls can cause injuries. They can happen to people of all ages. There are many things you can do to make your home safe and to help prevent falls. What can I do on the outside of my home? Regularly fix the edges of walkways and driveways and fix any cracks. Remove anything that might make you trip as you walk through a door, such as a raised step or threshold. Trim any bushes or trees on the path to your home. Use bright outdoor lighting. Clear any walking paths of anything that might make someone trip, such as rocks or tools. Regularly check to see if handrails are loose or broken. Make sure that both sides of any steps have handrails. Any raised decks and porches should have guardrails on the edges. Have any leaves, snow, or ice cleared regularly. Use sand or salt on walking paths during winter. Clean up any spills in your garage right away. This  includes oil or grease spills. What can I do in the bathroom? Use night lights. Install grab bars by the toilet and in the tub and shower. Do not use towel bars as grab bars. Use non-skid mats or decals in the tub or shower. If you need to sit down in the shower, use a plastic, non-slip stool. Keep the floor dry. Clean up any water that spills on the floor as soon as it happens. Remove soap buildup in the tub or shower regularly. Attach bath mats securely with double-sided non-slip rug tape. Do not have throw rugs and other things on the floor that can make you trip. What can I do in the bedroom? Use night lights. Make sure that you have a light by your bed that is easy to reach. Do not use any sheets or blankets that are too big for your bed. They should not hang down onto the floor. Have a firm chair that has side arms. You can use this for support while you get dressed. Do not have throw rugs and other things on the floor that can make you trip. What can I do in the kitchen? Clean up any spills right away.  Avoid walking on wet floors. Keep items that you use a lot in easy-to-reach places. If you need to reach something above you, use a strong step stool that has a grab bar. Keep electrical cords out of the way. Do not use floor polish or wax that makes floors slippery. If you must use wax, use non-skid floor wax. Do not have throw rugs and other things on the floor that can make you trip. What can I do with my stairs? Do not leave any items on the stairs. Make sure that there are handrails on both sides of the stairs and use them. Fix handrails that are broken or loose. Make sure that handrails are as long as the stairways. Check any carpeting to make sure that it is firmly attached to the stairs. Fix any carpet that is loose or worn. Avoid having throw rugs at the top or bottom of the stairs. If you do have throw rugs, attach them to the floor with carpet tape. Make sure that you  have a light switch at the top of the stairs and the bottom of the stairs. If you do not have them, ask someone to add them for you. What else can I do to help prevent falls? Wear shoes that: Do not have high heels. Have rubber bottoms. Are comfortable and fit you well. Are closed at the toe. Do not wear sandals. If you use a stepladder: Make sure that it is fully opened. Do not climb a closed stepladder. Make sure that both sides of the stepladder are locked into place. Ask someone to hold it for you, if possible. Clearly mark and make sure that you can see: Any grab bars or handrails. First and last steps. Where the edge of each step is. Use tools that help you move around (mobility aids) if they are needed. These include: Canes. Walkers. Scooters. Crutches. Turn on the lights when you go into a dark area. Replace any light bulbs as soon as they burn out. Set up your furniture so you have a clear path. Avoid moving your furniture around. If any of your floors are uneven, fix them. If there are any pets around you, be aware of where they are. Review your medicines with your doctor. Some medicines can make you feel dizzy. This can increase your chance of falling. Ask your doctor what other things that you can do to help prevent falls. This information is not intended to replace advice given to you by your health care provider. Make sure you discuss any questions you have with your health care provider. Document Released: 05/27/2009 Document Revised: 01/06/2016 Document Reviewed: 09/04/2014 Elsevier Interactive Patient Education  2017 [employer].

## 2022-05-29 ENCOUNTER — Other Ambulatory Visit: Payer: Self-pay | Admitting: Family Medicine

## 2022-05-30 ENCOUNTER — Ambulatory Visit
Admission: RE | Admit: 2022-05-30 | Discharge: 2022-05-30 | Disposition: A | Discharge status: Home / Self Care | Payer: Medicare HMO | Source: Ambulatory Visit | Attending: Family Medicine | Admitting: Family Medicine

## 2022-05-30 DIAGNOSIS — Z1231 Encounter for screening mammogram for malignant neoplasm of breast: Secondary | ICD-10-CM | POA: Diagnosis not present

## 2022-05-30 DIAGNOSIS — E2839 Other primary ovarian failure: Secondary | ICD-10-CM | POA: Diagnosis not present

## 2022-05-30 DIAGNOSIS — M85852 Other specified disorders of bone density and structure, left thigh: Secondary | ICD-10-CM | POA: Diagnosis not present

## 2022-06-14 ENCOUNTER — Other Ambulatory Visit: Payer: Self-pay | Admitting: Family Medicine

## 2022-07-04 DIAGNOSIS — H04123 Dry eye syndrome of bilateral lacrimal glands: Secondary | ICD-10-CM | POA: Diagnosis not present

## 2022-07-04 DIAGNOSIS — Z135 Encounter for screening for eye and ear disorders: Secondary | ICD-10-CM | POA: Diagnosis not present

## 2022-07-04 DIAGNOSIS — Z961 Presence of intraocular lens: Secondary | ICD-10-CM | POA: Diagnosis not present

## 2022-07-04 DIAGNOSIS — H5203 Hypermetropia, bilateral: Secondary | ICD-10-CM | POA: Diagnosis not present

## 2022-07-04 DIAGNOSIS — H52223 Regular astigmatism, bilateral: Secondary | ICD-10-CM | POA: Diagnosis not present

## 2022-07-04 DIAGNOSIS — H524 Presbyopia: Secondary | ICD-10-CM | POA: Diagnosis not present

## 2022-07-04 LAB — HM DIABETES EYE EXAM

## 2022-07-07 ENCOUNTER — Other Ambulatory Visit: Payer: Self-pay | Admitting: Family Medicine

## 2022-08-10 ENCOUNTER — Other Ambulatory Visit: Payer: Self-pay | Admitting: Family Medicine

## 2022-08-10 NOTE — Telephone Encounter (Signed)
Refill request for zolpidem (AMBIEN) 10 MG tablet   LOV - 04/19/22 Next OV - 10/18/22 Last refill - 12/21/21 #30/3

## 2022-09-21 ENCOUNTER — Other Ambulatory Visit: Payer: Self-pay | Admitting: Family Medicine

## 2022-10-18 ENCOUNTER — Ambulatory Visit (INDEPENDENT_AMBULATORY_CARE_PROVIDER_SITE_OTHER): Payer: Medicare HMO | Admitting: Family Medicine

## 2022-10-18 VITALS — BP 116/68 | HR 73 | Temp 97.4°F | Ht 65.5 in | Wt 216.0 lb

## 2022-10-18 DIAGNOSIS — Z6835 Body mass index (BMI) 35.0-35.9, adult: Secondary | ICD-10-CM | POA: Diagnosis not present

## 2022-10-18 DIAGNOSIS — R69 Illness, unspecified: Secondary | ICD-10-CM | POA: Diagnosis not present

## 2022-10-18 DIAGNOSIS — E119 Type 2 diabetes mellitus without complications: Secondary | ICD-10-CM | POA: Diagnosis not present

## 2022-10-18 DIAGNOSIS — E785 Hyperlipidemia, unspecified: Secondary | ICD-10-CM

## 2022-10-18 DIAGNOSIS — J209 Acute bronchitis, unspecified: Secondary | ICD-10-CM | POA: Insufficient documentation

## 2022-10-18 DIAGNOSIS — F17200 Nicotine dependence, unspecified, uncomplicated: Secondary | ICD-10-CM

## 2022-10-18 DIAGNOSIS — E1169 Type 2 diabetes mellitus with other specified complication: Secondary | ICD-10-CM | POA: Diagnosis not present

## 2022-10-18 DIAGNOSIS — R059 Cough, unspecified: Secondary | ICD-10-CM

## 2022-10-18 LAB — POCT GLYCOSYLATED HEMOGLOBIN (HGB A1C): Hemoglobin A1C: 6.6 % — AB (ref 4.0–5.6)

## 2022-10-18 LAB — POC COVID19 BINAXNOW: SARS Coronavirus 2 Ag: NEGATIVE

## 2022-10-18 MED ORDER — PREDNISONE 10 MG PO TABS: ORAL_TABLET | ORAL | 0 refills | Status: DC

## 2022-10-18 NOTE — Progress Notes (Signed)
Subjective:    Patient ID: Kathleen Hernandez, female    DOB: [birth year], 67 y.o.   MRN: [id number]  HPI Pt presents for 6 mo f/u of DM2 and chronic medical problems  Also uri    Wt Readings from Last 3 Encounters:  10/18/22 216 lb (98 kg)  05/26/22 216 lb (98 kg)  04/19/22 216 lb 6 oz (98.1 kg)   35.40 kg/m  Samuel [place]- caught from grandchild Feels like a cold Pnd  Cough - very deep  Dry cough -no production  Some wheezing when she lies down Smokes 1/2 ppd  A little hoarse  ST  Neg covid test today   Did not do a covid test  No fever  No n/v/d    Otc: Tylenol   Results for orders placed or performed in visit on 10/18/22  HgB A1c  Result Value Ref Range   Hemoglobin A1C 6.6 (A) 4.0 - 5.6 %   HbA1c POC (<> result, manual entry)     HbA1c, POC (prediabetic range)     HbA1c, POC (controlled diabetic range)    POC COVID-19 BinaxNow  Result Value Ref Range   SARS Coronavirus 2 Ag Negative Negative      DM2  Lab Results  Component Value Date   HGBA1C 6.6 (A) 10/18/2022    Metformin XR 1000 mg daily  Diet : is ok overall  Drinks tea with tiny bit of sugar  Occ sweets/not often  Some snack foods    On arb and statin   HTN bp is stable today  No cp or palpitations or headaches or edema  No side effects to medicines  BP Readings from Last 3 Encounters:  10/18/22 116/68  04/19/22 126/74  10/27/21 124/70      Eye exam   Lab Results  Component Value Date   MICROALBUR 2.9 (H) 04/10/2022    Hyperlipidemia Lab Results  Component Value Date   CHOL 143 04/10/2022   HDL 44.80 04/10/2022   LDLCALC 74 04/10/2022   TRIG 122.0 04/10/2022   CHOLHDL 3 04/10/2022   Atorvastatin 20 mg daily    Patient Active Problem List   Diagnosis Date Noted   Acute bronchitis 10/18/2022   Routine general medical examination at a health care facility 04/19/2022   Estrogen deficiency 04/19/2022   Low back pain 03/01/2021   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   Leukocytosis 08/09/2020   Rhinorrhea 07/21/2020   PVC (premature ventricular contraction) 07/21/2020   Knee pain, bilateral 02/10/2020   Mixed incontinence urge and stress 02/10/2020   Encounter for general adult medical examination with abnormal findings 08/05/2019   Type 2 diabetes mellitus without complications ([place]) 123456   Hyperlipidemia associated with type 2 diabetes mellitus ([place]) 08/31/2017   Smoker 08/31/2017   Hypertension 08/31/2017   Encounter for screening mammogram for breast cancer 08/31/2017   Class 2 obesity due to excess calories with body mass index (BMI) of 35.0 to 35.9 in adult 08/31/2017   Depression with anxiety 08/31/2017   Insomnia 08/31/2017   Past Medical History:  Diagnosis Date   Depression    Diabetes mellitus without complication (HCC)    Family history of heart murmur    History of chickenpox    History of colon polyps    History of depression    History of gastroesophageal reflux (GERD)    History of hyperlipidemia    History of urinary incontinence    Hyperlipidemia    Hypertension  Past Surgical History:  Procedure Laterality Date   BREAST BIOPSY Right 2000   neg   BREAST REDUCTION SURGERY  2010   GALLBLADDER SURGERY  2009   INCONTINENCE SURGERY  2009   PARTIAL HYSTERECTOMY  2009   REDUCTION MAMMAPLASTY Bilateral 2000   Social History   Tobacco Use   Smoking status: Every Day    Packs/day: 1.00    Years: 41.00    Total pack years: 41.00    Types: Cigarettes   Smokeless tobacco: Never  Substance Use Topics   Alcohol use: No   Drug use: No   Family History  Problem Relation Age of Onset   Arthritis Mother    Heart attack Mother    Early death Mother    Alcohol abuse Father    Alzheimer's disease Father    Non-Hodgkin's lymphoma Sister    Depression Sister    Alcohol abuse Brother    COPD Brother    Heart attack Brother    Hyperlipidemia Brother    Diabetes Maternal Grandmother     Alcohol abuse Maternal Grandfather    Alzheimer's disease Paternal Grandmother    Drug abuse Son    Allergies  Allergen Reactions   Requip [Ropinirole Hcl] Other (See Comments)    Tongue swelling   Chantix [Varenicline] Rash   Current Outpatient Medications on File Prior to Visit  Medication Sig Dispense Refill   ACCU-CHEK FASTCLIX LANCETS MISC Check sugar once daily and as needed for diabetes. DX: E11.9 90 each 3   aspirin 325 MG tablet Take 325 mg by mouth daily.     atorvastatin (LIPITOR) 20 MG tablet TAKE 1 TABLET BY MOUTH EVERY DAY 90 tablet 1   Cholecalciferol (VITAMIN D3) 25 MCG (1000 UT) CAPS Take 1 capsule by mouth daily.     EPINEPHrine (EPIPEN 2-PAK) 0.3 mg/0.3 mL IJ SOAJ injection Inject 0.3 mg into the muscle as needed for anaphylaxis. 1 each 0   glucose blood (ACCU-CHEK GUIDE) test strip USE TO CHECK BLOOD SUGAR ONCE DAILY AND AS NEEDED FOR DM (DX. E11.9) 100 strip 1   hydrochlorothiazide (HYDRODIURIL) 25 MG tablet TAKE 1 TABLET (25 MG TOTAL) BY MOUTH DAILY. 90 tablet 1   losartan (COZAAR) 100 MG tablet TAKE 1 TABLET BY MOUTH EVERY DAY 90 tablet 1   metFORMIN (GLUCOPHAGE-XR) 500 MG 24 hr tablet TAKE 2 TABLETS BY MOUTH EVERY DAY WITH BREAKFAST 180 tablet 1   Omega-3 Fatty Acids (FISH OIL) 1200 MG CAPS Take 1 capsule by mouth daily.     PARoxetine (PAXIL) 40 MG tablet TAKE 1 TABLET BY MOUTH EVERY DAY 90 tablet 1   vitamin E 400 UNIT capsule Take 400 Units by mouth daily.     zolpidem (AMBIEN) 10 MG tablet TAKE ONE TABLET BY MOUTH EVERY NIGHT AT BEDTIME AS NEEDED FOR SLEEP 30 tablet 3   No current facility-administered medications on file prior to visit.     Review of Systems  Constitutional:  Positive for fatigue. Negative for activity change, appetite change, fever and unexpected weight change.  HENT:  Positive for postnasal drip, sneezing and voice change. Negative for congestion, ear pain, rhinorrhea, sinus pressure and sore throat.   Eyes:  Negative for pain,  discharge, redness and visual disturbance.  Respiratory:  Positive for cough and wheezing. Negative for shortness of breath and stridor.   Cardiovascular:  Negative for chest pain and palpitations.  Gastrointestinal:  Negative for abdominal pain, blood in stool, constipation, diarrhea, nausea and vomiting.  Endocrine:  Negative for polydipsia and polyuria.  Genitourinary:  Negative for dysuria, frequency, hematuria and urgency.  Musculoskeletal:  Negative for arthralgias, back pain and myalgias.  Skin:  Negative for pallor and rash.  Allergic/Immunologic: Negative for environmental allergies.  Neurological:  Negative for dizziness, syncope, weakness, light-headedness and headaches.  Hematological:  Negative for adenopathy. Does not bruise/bleed easily.  Psychiatric/Behavioral:  Negative for confusion, decreased concentration and dysphoric mood. The patient is not nervous/anxious.        Objective:   Physical Exam Constitutional:      General: She is not in acute distress.    Appearance: Normal appearance. She is well-developed. She is obese. She is not ill-appearing, toxic-appearing or diaphoretic.  HENT:     Head: Normocephalic and atraumatic.     Comments: Nares are injected and congested      Right Ear: Tympanic membrane, ear canal and external ear normal.     Left Ear: Tympanic membrane, ear canal and external ear normal.     Nose: Congestion and rhinorrhea present.     Mouth/Throat:     Mouth: Mucous membranes are moist.     Pharynx: Oropharynx is clear. No oropharyngeal exudate or posterior oropharyngeal erythema.     Comments: Clear pnd  Eyes:     General:        Right eye: No discharge.        Left eye: No discharge.     Conjunctiva/sclera: Conjunctivae normal.     Pupils: Pupils are equal, round, and reactive to light.  Cardiovascular:     Rate and Rhythm: Normal rate.     Heart sounds: Normal heart sounds.  Pulmonary:     Effort: Pulmonary effort is normal. No  respiratory distress.     Breath sounds: No stridor. Wheezing and rhonchi present. No rales.     Comments: Diffusely distant bs Scattered rhonchi Occ end exp wheeze Nl exp time  Chest:     Chest wall: No tenderness.  Musculoskeletal:     Cervical back: Normal range of motion and neck supple.  Lymphadenopathy:     Cervical: No cervical adenopathy.  Skin:    General: Skin is warm and dry.     Capillary Refill: Capillary refill takes less than 2 seconds.     Findings: No rash.  Neurological:     Mental Status: She is alert.     Cranial Nerves: No cranial nerve deficit.  Psychiatric:        Mood and Affect: Mood normal.           Assessment & Plan:   Problem List Items Addressed This Visit       Respiratory   Acute bronchitis - Primary    In smoker Counseled on smoking cessation  Px prednisone  Disc sympt care-see AVS Disc ER precautions  Update if not starting to improve in a week or if worsening   Neg covid test today      Relevant Orders   POC COVID-19 BinaxNow (Completed)     Endocrine   Hyperlipidemia associated with type 2 diabetes mellitus ([place])    Disc goals for lipids and reasons to control them Rev last labs with pt Rev low sat fat diet in detail  LDL of 74 close ot goal Plan to continue atorvastatin 20 mg daily        Type 2 diabetes mellitus without complications (HCC)    Lab Results  Component Value Date   HGBA1C 6.6 (A) 10/18/2022  Improved  Plan to continue working on low glycemic diet  Continue metformin xr 1000 mg daily  On arb and statin  Sent for dm eye exam recent       Relevant Orders   HgB A1c (Completed)     Other   Class 2 obesity due to excess calories with body mass index (BMI) of 35.0 to 35.9 in adult    Discussed how this problem influences overall health and the risks it imposes  Reviewed plan for weight loss with lower calorie diet (via better food choices and also portion control or program like weight watchers) and  exercise building up to or more than 30 minutes 5 days per week including some aerobic activity        Smoker    Disc in detail risks of smoking and possible outcomes including copd, vascular/ heart disease, cancer , respiratory and sinus infections  Pt voices understanding  She is not ready to consider quitting Is currently sick with bronchitis       Other Visit Diagnoses     Cough, unspecified type

## 2022-10-18 NOTE — Assessment & Plan Note (Signed)
Disc in detail risks of smoking and possible outcomes including copd, vascular/ heart disease, cancer , respiratory and sinus infections  Pt voices understanding  She is not ready to consider quitting Is currently sick with bronchitis

## 2022-10-18 NOTE — Assessment & Plan Note (Signed)
Lab Results  Component Value Date   HGBA1C 6.6 (A) 10/18/2022   Improved Plan to continue working on low glycemic diet  Continue metformin xr 1000 mg daily  On arb and statin  Sent for dm eye exam recent

## 2022-10-18 NOTE — Assessment & Plan Note (Signed)
In smoker Counseled on smoking cessation  Px prednisone  Disc sympt care-see AVS Disc ER precautions  Update if not starting to improve in a week or if worsening   Neg covid test today

## 2022-10-18 NOTE — Assessment & Plan Note (Signed)
Discussed how this problem influences overall health and the risks it imposes  Reviewed plan for weight loss with lower calorie diet (via better food choices and also portion control or program like weight watchers) and exercise building up to or more than 30 minutes 5 days per week including some aerobic activity    

## 2022-10-18 NOTE — Patient Instructions (Addendum)
Try to get most of your carbohydrates from produce (with the exception of white potatoes)  Eat less bread/pasta/rice/snack foods/cereals/sweets and other items from the middle of the grocery store (processed carbs)  Take the prednisone as directed for bronchitis  It will make you hyper and hungry and your glucose will go up temporarily Try delsym for cough  If cough becomes productive try mucinex dm instead Fluids and rest  Update if not starting to improve in a week or if worsening    If you suddenly worsen go to the ER / let us know   Take care of yourself

## 2022-10-18 NOTE — Assessment & Plan Note (Signed)
Disc goals for lipids and reasons to control them Rev last labs with pt Rev low sat fat diet in detail  LDL of 74 close ot goal Plan to continue atorvastatin 20 mg daily

## 2022-11-06 ENCOUNTER — Other Ambulatory Visit: Payer: Self-pay | Admitting: Family Medicine

## 2022-11-24 ENCOUNTER — Ambulatory Visit (INDEPENDENT_AMBULATORY_CARE_PROVIDER_SITE_OTHER): Payer: Medicare HMO

## 2022-11-24 ENCOUNTER — Telehealth: Payer: Self-pay | Admitting: Family Medicine

## 2022-11-24 ENCOUNTER — Ambulatory Visit (INDEPENDENT_AMBULATORY_CARE_PROVIDER_SITE_OTHER): Payer: Medicare HMO | Admitting: Family

## 2022-11-24 ENCOUNTER — Encounter: Payer: Self-pay | Admitting: Family

## 2022-11-24 ENCOUNTER — Other Ambulatory Visit: Payer: Self-pay | Admitting: Family Medicine

## 2022-11-24 VITALS — BP 136/72 | HR 67 | Temp 97.5°F | Ht 65.0 in | Wt 215.5 lb

## 2022-11-24 DIAGNOSIS — R059 Cough, unspecified: Secondary | ICD-10-CM | POA: Diagnosis not present

## 2022-11-24 DIAGNOSIS — J209 Acute bronchitis, unspecified: Secondary | ICD-10-CM

## 2022-11-24 DIAGNOSIS — I10 Essential (primary) hypertension: Secondary | ICD-10-CM | POA: Diagnosis not present

## 2022-11-24 MED ORDER — AZITHROMYCIN 250 MG PO TABS
ORAL_TABLET | ORAL | 0 refills | Status: AC
Start: 2022-11-24 — End: 2022-11-29

## 2022-11-24 NOTE — Telephone Encounter (Signed)
Please schedule an appt with any available provider, we don't prescribe abx without pt seeing a provider

## 2022-11-24 NOTE — Telephone Encounter (Signed)
Patient called in and stated that she has been dealing with a sinus infection for a few days. She was wanting to know if Dr. Milinda Antis would send her something in for a cough and an antibiotic. Informed patient that she will probably need an appointment before anything could be sent in. Thank you!

## 2022-11-24 NOTE — Assessment & Plan Note (Addendum)
Slightly elevated today.  Patient remains compliant with medications, losartan 100 mg daily, hydrochlorothiazide 25 mg to.  Review of previous blood pressure  reveals excellent control.We were jointly reluctant to change medication regimen at this time while she is not feeling well or sleeping well.  Close follow-up in 1 week with myself or PCP for blood pressure recheck

## 2022-11-24 NOTE — Telephone Encounter (Signed)
Patient is scheduled at the [facility] office today.

## 2022-11-24 NOTE — Progress Notes (Signed)
Assessment & Plan:  Acute bronchitis, unspecified organism -     DG Chest 2 View; Future -     Azithromycin; Take 2 tablets on day 1, then 1 tablet daily on days 2 through 5  Dispense: 6 tablet; Refill: 0  Primary hypertension Assessment & Plan: Slightly elevated today.  Patient remains compliant with medications, losartan 100 mg daily, hydrochlorothiazide 25 mg to.  Review of previous blood pressure  reveals excellent control.We were jointly reluctant to change medication regimen at this time while she is not feeling well or sleeping well.  Close follow-up in 1 week with myself or PCP for blood pressure recheck      Return precautions given.   Risks, benefits, and alternatives of the medications and treatment plan prescribed today were discussed, and patient expressed understanding.   Education regarding symptom management and diagnosis given to patient on AVS either electronically or printed.  Return in about 1 week (around 12/01/2022).  Rennie Plowman, FNP  Subjective:    Patient ID: Kathleen Hernandez, female    DOB: 1955/09/29, 67 y.o.   MRN: [id number]  CC: KMYA PLACIDE is a 67 y.o. female who presents today for an acute visit.    HPI: Complains of cough for a week and a half, worsening.   Occassional sputum from cough. Endorses nasal congestion, tactile warmth.   No sob, leg swelling, orthopnea, ear pain, chills.   Cough is worse when laying down.   She is taking tylenol. Tessalon perles are ineffective.     No recent antibiotics. History of hypertension, diabetes, hyperlipidemia Smoker A1c 6.6  Treated for acute bronchitis by PCP 10/18/2022 with prednisone with complete resolution of cough.   Allergies: Requip [ropinirole hcl] and Chantix [varenicline] Current Outpatient Medications on File Prior to Visit  Medication Sig Dispense Refill   ACCU-CHEK FASTCLIX LANCETS MISC Check sugar once daily and as needed for diabetes. DX: E11.9 90 each 3   aspirin 325  MG tablet Take 325 mg by mouth daily.     atorvastatin (LIPITOR) 20 MG tablet TAKE 1 TABLET BY MOUTH EVERY DAY 90 tablet 1   Cholecalciferol (VITAMIN D3) 25 MCG (1000 UT) CAPS Take 1 capsule by mouth daily.     EPINEPHrine (EPIPEN 2-PAK) 0.3 mg/0.3 mL IJ SOAJ injection Inject 0.3 mg into the muscle as needed for anaphylaxis. 1 each 0   glucose blood (ACCU-CHEK GUIDE) test strip USE TO CHECK BLOOD SUGAR ONCE DAILY AND AS NEEDED FOR DM (DX. E11.9) 100 strip 1   hydrochlorothiazide (HYDRODIURIL) 25 MG tablet TAKE 1 TABLET (25 MG TOTAL) BY MOUTH DAILY. 90 tablet 1   losartan (COZAAR) 100 MG tablet TAKE 1 TABLET BY MOUTH EVERY DAY 90 tablet 1   metFORMIN (GLUCOPHAGE-XR) 500 MG 24 hr tablet TAKE 2 TABLETS BY MOUTH EVERY DAY WITH BREAKFAST 180 tablet 1   Omega-3 Fatty Acids (FISH OIL) 1200 MG CAPS Take 1 capsule by mouth daily.     PARoxetine (PAXIL) 40 MG tablet TAKE 1 TABLET BY MOUTH EVERY DAY 90 tablet 1   vitamin E 400 UNIT capsule Take 400 Units by mouth daily.     zolpidem (AMBIEN) 10 MG tablet TAKE ONE TABLET BY MOUTH EVERY NIGHT AT BEDTIME AS NEEDED FOR SLEEP 30 tablet 3   No current facility-administered medications on file prior to visit.    Review of Systems  Constitutional:  Negative for chills and fever.  HENT:  Positive for congestion and postnasal drip. Negative for sinus pressure.  Respiratory:  Positive for cough. Negative for shortness of breath.   Cardiovascular:  Negative for chest pain and palpitations.  Gastrointestinal:  Negative for nausea and vomiting.      Objective:    BP 136/72   Pulse 67   Temp (!) 97.5 F (36.4 C) (Oral)   Ht 5\' 5"  (1.651 m)   Wt 215 lb 8 oz (97.8 kg)   SpO2 98%   BMI 35.86 kg/m   BP Readings from Last 3 Encounters:  11/24/22 136/72  10/18/22 116/68  04/19/22 126/74   Wt Readings from Last 3 Encounters:  11/24/22 215 lb 8 oz (97.8 kg)  10/18/22 216 lb (98 kg)  05/26/22 216 lb (98 kg)    Physical Exam Vitals reviewed.   Constitutional:      Appearance: She is well-developed.  HENT:     Head: Normocephalic and atraumatic.     Right Ear: Hearing, tympanic membrane, ear canal and external ear normal. No decreased hearing noted. No drainage, swelling or tenderness. No middle ear effusion. No foreign body. Tympanic membrane is not erythematous or bulging.     Left Ear: Hearing, tympanic membrane, ear canal and external ear normal. No decreased hearing noted. No drainage, swelling or tenderness.  No middle ear effusion. No foreign body. Tympanic membrane is not erythematous or bulging.     Nose: Nose normal. No rhinorrhea.     Right Sinus: No maxillary sinus tenderness or frontal sinus tenderness.     Left Sinus: No maxillary sinus tenderness or frontal sinus tenderness.     Mouth/Throat:     Pharynx: Uvula midline. No oropharyngeal exudate or posterior oropharyngeal erythema.     Tonsils: No tonsillar abscesses.  Eyes:     Conjunctiva/sclera: Conjunctivae normal.  Cardiovascular:     Rate and Rhythm: Regular rhythm.     Pulses: Normal pulses.     Heart sounds: Normal heart sounds.  Pulmonary:     Effort: Pulmonary effort is normal.     Breath sounds: Normal breath sounds. No wheezing, rhonchi or rales.  Lymphadenopathy:     Head:     Right side of head: No submental, submandibular, tonsillar, preauricular, posterior auricular or occipital adenopathy.     Left side of head: No submental, submandibular, tonsillar, preauricular, posterior auricular or occipital adenopathy.     Cervical: No cervical adenopathy.  Skin:    General: Skin is warm and dry.  Neurological:     Mental Status: She is alert.  Psychiatric:        Speech: Speech normal.        Behavior: Behavior normal.        Thought Content: Thought content normal.

## 2022-11-24 NOTE — Patient Instructions (Addendum)
Blood pressure elevated today.  Please monitor how you are feeling and remain compliant with medications.  Do not take any medications that have a decongestant as this may raise blood pressure.  Will recheck again next week   start zpak  Ensure to take probiotics while on antibiotics and also for 2 weeks after completion. This can either be by eating yogurt daily or taking a probiotic supplement over the counter such as Culturelle.It is important to re-colonize the gut with good bacteria and also to prevent any diarrheal infections associated with antibiotic use.   Start mucinex over the counter to help break up thick mucus  Drink plenty of water as this will help thin thick secretions  Please let me know if no resolution of cough

## 2022-11-27 ENCOUNTER — Other Ambulatory Visit: Payer: Self-pay | Admitting: Family

## 2022-11-27 ENCOUNTER — Telehealth: Payer: Self-pay

## 2022-11-27 DIAGNOSIS — J209 Acute bronchitis, unspecified: Secondary | ICD-10-CM

## 2022-11-27 MED ORDER — PROMETHAZINE-DM 6.25-15 MG/5ML PO SYRP
5.0000 mL | ORAL_SOLUTION | Freq: Every evening | ORAL | 1 refills | Status: DC | PRN
Start: 2022-11-27 — End: 2023-02-14

## 2022-11-27 NOTE — Telephone Encounter (Signed)
Pt is aware.  

## 2022-11-27 NOTE — Telephone Encounter (Signed)
Pt walked in office and would like a rx for cough syrup/ medicine sent in to pharmacy!

## 2022-12-06 ENCOUNTER — Ambulatory Visit: Payer: Medicare HMO | Admitting: Family

## 2023-02-12 ENCOUNTER — Other Ambulatory Visit: Payer: Self-pay | Admitting: Family Medicine

## 2023-02-13 NOTE — Telephone Encounter (Signed)
Name of Medication: Ambien Name of [facility]acy: [facility] Last Fill or Written Date and Quantity: 08/10/22 #30 tab/ 3 refills   Last Office Visit and Type: f/u 10/18/22 Next Office Visit and Type:  CPE 04/23/23

## 2023-02-13 NOTE — Progress Notes (Unsigned)
    Kathleen Olarte T. Levetta Bognar, MD, CAQ Sports Medicine [facility] [address] [place], [address]  Phone: [phone]  FAX: [phone]  Kathleen Hernandez - 67 y.o. female  MRN [id number]  Date of Birth: 20-May-1956  Date: 02/14/2023  PCP: Judy Pimple, MD  Referral: Judy Pimple, MD  No chief complaint on file.  Subjective:   Kathleen Hernandez is a 68 y.o. very pleasant female patient with There is no height or weight on file to calculate BMI. who presents with the following:  She is a very pleasant patient, she presents with ongoing bilateral knee pain.    Review of Systems is noted in the HPI, as appropriate  Objective:   There were no vitals taken for this visit.  GEN: No acute distress; alert,appropriate. PULM: Breathing comfortably in no respiratory distress PSYCH: Normally interactive.   Laboratory and Imaging Data:  Assessment and Plan:   ***

## 2023-02-14 ENCOUNTER — Encounter: Payer: Self-pay | Admitting: Family Medicine

## 2023-02-14 ENCOUNTER — Ambulatory Visit (INDEPENDENT_AMBULATORY_CARE_PROVIDER_SITE_OTHER): Payer: Medicare HMO | Admitting: Family Medicine

## 2023-02-14 VITALS — BP 100/70 | HR 75 | Temp 97.9°F | Ht 65.0 in | Wt 215.2 lb

## 2023-02-14 DIAGNOSIS — M25561 Pain in right knee: Secondary | ICD-10-CM | POA: Diagnosis not present

## 2023-02-14 DIAGNOSIS — M25562 Pain in left knee: Secondary | ICD-10-CM

## 2023-02-14 DIAGNOSIS — G8929 Other chronic pain: Secondary | ICD-10-CM

## 2023-02-14 MED ORDER — CELECOXIB 200 MG PO CAPS: 200.0000 mg | ORAL_CAPSULE | Freq: Every day | ORAL | 2 refills | Status: DC

## 2023-02-14 MED ORDER — TRIAMCINOLONE ACETONIDE 40 MG/ML IJ SUSP
40.0000 mg | Freq: Once | INTRAMUSCULAR | Status: AC
Start: 2023-02-14 — End: 2023-02-14
  Administered 2023-02-14: 40 mg via INTRA_ARTICULAR

## 2023-02-14 NOTE — Addendum Note (Signed)
Addended by: Damita Lack on: 02/14/2023 09:58 AM   Modules accepted: Orders

## 2023-03-11 ENCOUNTER — Other Ambulatory Visit: Payer: Self-pay | Admitting: Family Medicine

## 2023-03-16 ENCOUNTER — Other Ambulatory Visit: Payer: Self-pay | Admitting: Family Medicine

## 2023-03-29 ENCOUNTER — Encounter (INDEPENDENT_AMBULATORY_CARE_PROVIDER_SITE_OTHER): Payer: Self-pay

## 2023-04-10 ENCOUNTER — Encounter: Payer: Self-pay | Admitting: *Deleted

## 2023-04-10 ENCOUNTER — Ambulatory Visit
Admission: EM | Admit: 2023-04-10 | Discharge: 2023-04-10 | Disposition: A | Discharge status: Home / Self Care | Payer: Medicare HMO | Attending: Family Medicine | Admitting: Family Medicine

## 2023-04-10 DIAGNOSIS — S91301A Unspecified open wound, right foot, initial encounter: Secondary | ICD-10-CM | POA: Diagnosis not present

## 2023-04-10 DIAGNOSIS — E1169 Type 2 diabetes mellitus with other specified complication: Secondary | ICD-10-CM

## 2023-04-10 DIAGNOSIS — L97511 Non-pressure chronic ulcer of other part of right foot limited to breakdown of skin: Secondary | ICD-10-CM

## 2023-04-10 DIAGNOSIS — E11621 Type 2 diabetes mellitus with foot ulcer: Secondary | ICD-10-CM | POA: Diagnosis not present

## 2023-04-10 MED ORDER — AMOXICILLIN-POT CLAVULANATE 875-125 MG PO TABS: 1.0000 | ORAL_TABLET | Freq: Two times a day (BID) | ORAL | 0 refills | Status: AC

## 2023-04-10 MED ORDER — MUPIROCIN 2 % EX OINT: 1.0000 | TOPICAL_OINTMENT | Freq: Two times a day (BID) | CUTANEOUS | 0 refills | Status: AC

## 2023-04-10 NOTE — ED Triage Notes (Signed)
Patient presents to UC for blisters on right foot x 5 days. States she walked up ramp and developed two, one on her great big toe and one on the heel. No OTC meds. She is diabetic and concerned with infection.

## 2023-04-10 NOTE — ED Provider Notes (Signed)
Renaldo Fiddler    CSN: [id number] Arrival date & time: 04/10/23  1029      History   Chief Complaint Chief Complaint  Patient presents with  . Blister    HPI Kathleen Hernandez is a 67 y.o. female.   HPI  Past Medical History:  Diagnosis Date  . Depression   . Diabetes mellitus without complication (HCC)   . Family history of heart murmur   . History of chickenpox   . History of colon polyps   . History of depression   . History of gastroesophageal reflux (GERD)   . History of hyperlipidemia   . History of urinary incontinence   . Hyperlipidemia   . Hypertension     Patient Active Problem List   Diagnosis Date Noted  . Routine general medical examination at a health care facility 04/19/2022  . Estrogen deficiency 04/19/2022  . Low back pain 03/01/2021  . 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 Leukocytosis 08/09/2020  . PVC (premature ventricular contraction) 07/21/2020  . Knee pain, bilateral 02/10/2020  . Mixed incontinence urge and stress 02/10/2020  . Encounter for general adult medical examination with abnormal findings 08/05/2019  . Type 2 diabetes mellitus without complications (HCC) 08/23/2018  . Hyperlipidemia associated with type 2 diabetes mellitus (HCC) 08/31/2017  . Smoker 08/31/2017  . Hypertension 08/31/2017  . Encounter for screening mammogram for breast cancer 08/31/2017  . Class 2 obesity due to excess calories with body mass index (BMI) of 35.0 to 35.9 in adult 08/31/2017  . Depression with anxiety 08/31/2017  . Insomnia 08/31/2017    Past Surgical History:  Procedure Laterality Date  . BREAST BIOPSY Right 2000   neg  . BREAST REDUCTION SURGERY  2010  . GALLBLADDER SURGERY  2009  . INCONTINENCE SURGERY  2009  . PARTIAL HYSTERECTOMY  2009  . REDUCTION MAMMAPLASTY Bilateral 2000    OB History   No obstetric history on file.      Home Medications    Prior to Admission medications   Medication Sig  Start Date End Date Taking? Authorizing Provider  ACCU-CHEK FASTCLIX LANCETS MISC Check sugar once daily and as needed for diabetes. DX: E11.9 09/13/18   Tower, Audrie Gallus, MD  aspirin 325 MG tablet Take 325 mg by mouth daily.    [provider]  atorvastatin (LIPITOR) 20 MG tablet TAKE 1 TABLET BY MOUTH EVERY DAY 03/16/23   Tower, Audrie Gallus, MD  celecoxib (CELEBREX) 200 MG capsule Take 1 capsule (200 mg total) by mouth daily. 02/14/23   Copland, Karleen Hampshire, MD  Cholecalciferol (VITAMIN D3) 25 MCG (1000 UT) CAPS Take 1 capsule by mouth daily.    [provider]  EPINEPHrine (EPIPEN 2-PAK) 0.3 mg/0.3 mL IJ SOAJ injection Inject 0.3 mg into the muscle as needed for anaphylaxis. 02/03/21   Dionne Bucy, MD  glucose blood (ACCU-CHEK GUIDE) test strip USE TO CHECK BLOOD SUGAR ONCE DAILY AND AS NEEDED FOR DM (DX. E11.9) 09/22/19   Tower, Audrie Gallus, MD  hydrochlorothiazide (HYDRODIURIL) 25 MG tablet TAKE 1 TABLET (25 MG TOTAL) BY MOUTH DAILY. 06/14/22   Tower, Audrie Gallus, MD  losartan (COZAAR) 100 MG tablet TAKE 1 TABLET BY MOUTH EVERY DAY 11/06/22   Tower, Audrie Gallus, MD  metFORMIN (GLUCOPHAGE-XR) 500 MG 24 hr tablet TAKE 2 TABLETS BY MOUTH EVERY DAY WITH BREAKFAST 11/24/22   Tower, Audrie Gallus, MD  Omega-3 Fatty Acids (FISH OIL) 1200 MG CAPS Take 1 capsule by mouth daily.  [provider]  PARoxetine (PAXIL) 40 MG tablet TAKE 1 TABLET BY MOUTH EVERY DAY 03/13/23   Joaquim Nam, MD  vitamin E 400 UNIT capsule Take 400 Units by mouth daily.    [provider]  zolpidem (AMBIEN) 10 MG tablet TAKE ONE TABLET BY MOUTH EVERY NIGHT AT BEDTIME AS NEEDED FOR SLEEP 02/13/23   Tower, Audrie Gallus, MD    Family History Family History  Problem Relation Age of Onset  . Arthritis Mother   . Heart attack Mother   . Early death Mother   . Alcohol abuse Father   . Alzheimer's disease Father   . Non-Hodgkin's lymphoma Sister   . Depression Sister   . Alcohol abuse Brother   . COPD Brother   . Heart  attack Brother   . Hyperlipidemia Brother   . Diabetes Maternal Grandmother   . Alcohol abuse Maternal Grandfather   . Alzheimer's disease Paternal Grandmother   . Drug abuse Son     Social History Social History   Tobacco Use  . Smoking status: Every Day    Current packs/day: 1.00    Average packs/day: 1 pack/day for 41.0 years (41.0 ttl pk-yrs)    Types: Cigarettes  . Smokeless tobacco: Never  Substance Use Topics  . Alcohol use: No  . Drug use: No     Allergies   Requip [ropinirole hcl] and Chantix [varenicline]   Review of Systems Review of Systems   Physical Exam Triage Vital Signs ED Triage Vitals  Encounter Vitals Group     BP 04/10/23 1128 116/70     Systolic BP Percentile --      Diastolic BP Percentile --      Pulse Rate 04/10/23 1128 64     Resp 04/10/23 1128 18     Temp 04/10/23 1128 97.8 F (36.6 C)     Temp Source 04/10/23 1128 Temporal     SpO2 04/10/23 1128 95 %     Weight --      Height --      Head Circumference --      Peak Flow --      Pain Score 04/10/23 1130 5     Pain Loc --      Pain Education --      Exclude from Growth Chart --    No data found.  Updated Vital Signs BP 116/70 (BP Location: Left Arm)   Pulse 64   Temp 97.8 F (36.6 C) (Temporal)   Resp 18   SpO2 95%   Visual Acuity Right Eye Distance:   Left Eye Distance:   Bilateral Distance:    Right Eye Near:   Left Eye Near:    Bilateral Near:     Physical Exam   UC Treatments / Results  Labs (all labs ordered are listed, but only abnormal results are displayed) Labs Reviewed - No data to display  EKG   Radiology No results found.  Procedures Procedures (including critical care time)  Medications Ordered in UC Medications - No data to display  Initial Impression / Assessment and Plan / UC Course  I have reviewed the triage vital signs and the nursing notes.  Pertinent labs & imaging results that were available during my care of the patient were  reviewed by me and considered in my medical decision making (see chart for details).     *** Final Clinical Impressions(s) / UC Diagnoses   Final diagnoses:  None   Discharge Instructions  None    ED Prescriptions   None    PDMP not reviewed this encounter.

## 2023-04-10 NOTE — Discharge Instructions (Addendum)
Complete entire course of antibiotics as prescribed.  If you notice the swelling or wounds on your feet are worsening and are not improving follow-up with the podiatrist listed on your discharge paperwork.

## 2023-04-16 ENCOUNTER — Telehealth: Payer: Self-pay | Admitting: Family Medicine

## 2023-04-16 DIAGNOSIS — E119 Type 2 diabetes mellitus without complications: Secondary | ICD-10-CM

## 2023-04-16 DIAGNOSIS — I10 Essential (primary) hypertension: Secondary | ICD-10-CM

## 2023-04-16 DIAGNOSIS — E785 Hyperlipidemia, unspecified: Secondary | ICD-10-CM

## 2023-04-16 NOTE — Telephone Encounter (Signed)
-----   Message from Lovena Neighbours sent at 04/02/2023 11:27 AM EDT ----- Regarding: Labs for 8.3.24 Please put fasting physical lab orders in future. Thank you, Denny Peon

## 2023-04-17 ENCOUNTER — Other Ambulatory Visit (INDEPENDENT_AMBULATORY_CARE_PROVIDER_SITE_OTHER): Payer: Medicare HMO

## 2023-04-17 DIAGNOSIS — E1169 Type 2 diabetes mellitus with other specified complication: Secondary | ICD-10-CM | POA: Diagnosis not present

## 2023-04-17 DIAGNOSIS — E119 Type 2 diabetes mellitus without complications: Secondary | ICD-10-CM

## 2023-04-17 DIAGNOSIS — E785 Hyperlipidemia, unspecified: Secondary | ICD-10-CM | POA: Diagnosis not present

## 2023-04-17 DIAGNOSIS — I10 Essential (primary) hypertension: Secondary | ICD-10-CM

## 2023-04-17 LAB — COMPREHENSIVE METABOLIC PANEL
ALT: 9 U/L (ref 0–35)
AST: 10 U/L (ref 0–37)
Albumin: 3.7 g/dL (ref 3.5–5.2)
Alkaline Phosphatase: 112 U/L (ref 39–117)
BUN: 16 mg/dL (ref 6–23)
CO2: 31 meq/L (ref 19–32)
Calcium: 9.2 mg/dL (ref 8.4–10.5)
Chloride: 101 meq/L (ref 96–112)
Creatinine, Ser: 0.73 mg/dL (ref 0.40–1.20)
GFR: 85.35 mL/min (ref >60.00)
Glucose, Bld: 108 mg/dL — ABNORMAL HIGH (ref 70–99)
Potassium: 4.4 meq/L (ref 3.5–5.1)
Sodium: 139 meq/L (ref 135–145)
Total Bilirubin: 0.4 mg/dL (ref 0.2–1.2)
Total Protein: 6.4 g/dL (ref 6.0–8.3)

## 2023-04-17 LAB — CBC WITH DIFFERENTIAL/PLATELET
Basophils Absolute: 0.1 10*3/uL (ref 0.0–0.1)
Basophils Relative: 0.5 % (ref 0.0–3.0)
Eosinophils Absolute: 0.3 10*3/uL (ref 0.0–0.7)
Eosinophils Relative: 3.1 % (ref 0.0–5.0)
HCT: 40.4 % (ref 36.0–46.0)
Hemoglobin: 12.9 g/dL (ref 12.0–15.0)
Lymphocytes Relative: 17.4 % (ref 12.0–46.0)
Lymphs Abs: 1.9 10*3/uL (ref 0.7–4.0)
MCHC: 32 g/dL (ref 30.0–36.0)
MCV: 91.6 fl (ref 78.0–100.0)
Monocytes Absolute: 1.2 10*3/uL — ABNORMAL HIGH (ref 0.1–1.0)
Monocytes Relative: 10.8 % (ref 3.0–12.0)
Neutro Abs: 7.5 10*3/uL (ref 1.4–7.7)
Neutrophils Relative %: 68.2 % (ref 43.0–77.0)
Platelets: 474 10*3/uL — ABNORMAL HIGH (ref 150.0–400.0)
RBC: 4.41 Mil/uL (ref 3.87–5.11)
RDW: 13.5 % (ref 11.5–15.5)
WBC: 11 10*3/uL — ABNORMAL HIGH (ref 4.0–10.5)

## 2023-04-17 LAB — MICROALBUMIN / CREATININE URINE RATIO
Creatinine,U: 239.6 mg/dL
Microalb Creat Ratio: 1 mg/g (ref 0.0–30.0)
Microalb, Ur: 2.4 mg/dL — ABNORMAL HIGH (ref 0.0–1.9)

## 2023-04-17 LAB — HEMOGLOBIN A1C: Hgb A1c MFr Bld: 6.6 % — ABNORMAL HIGH (ref 4.6–6.5)

## 2023-04-17 LAB — LIPID PANEL
Cholesterol: 141 mg/dL (ref 0–200)
HDL: 40.7 mg/dL (ref >39.00)
LDL Cholesterol: 72 mg/dL (ref 0–99)
NonHDL: 99.91
Total CHOL/HDL Ratio: 3
Triglycerides: 140 mg/dL (ref 0.0–149.0)
VLDL: 28 mg/dL (ref 0.0–40.0)

## 2023-04-18 LAB — TSH: TSH: 4.15 u[IU]/mL (ref 0.35–5.50)

## 2023-04-23 ENCOUNTER — Encounter: Payer: Self-pay | Admitting: Family Medicine

## 2023-04-23 ENCOUNTER — Other Ambulatory Visit: Payer: Self-pay | Admitting: *Deleted

## 2023-04-23 ENCOUNTER — Telehealth: Payer: Self-pay

## 2023-04-23 ENCOUNTER — Telehealth: Payer: Self-pay | Admitting: *Deleted

## 2023-04-23 ENCOUNTER — Ambulatory Visit (INDEPENDENT_AMBULATORY_CARE_PROVIDER_SITE_OTHER): Payer: Medicare HMO | Admitting: Family Medicine

## 2023-04-23 VITALS — BP 118/68 | HR 60 | Temp 97.8°F | Ht 65.5 in | Wt 211.1 lb

## 2023-04-23 DIAGNOSIS — N3946 Mixed incontinence: Secondary | ICD-10-CM

## 2023-04-23 DIAGNOSIS — F418 Other specified anxiety disorders: Secondary | ICD-10-CM

## 2023-04-23 DIAGNOSIS — Z1211 Encounter for screening for malignant neoplasm of colon: Secondary | ICD-10-CM

## 2023-04-23 DIAGNOSIS — D72829 Elevated white blood cell count, unspecified: Secondary | ICD-10-CM

## 2023-04-23 DIAGNOSIS — Z6835 Body mass index (BMI) 35.0-35.9, adult: Secondary | ICD-10-CM

## 2023-04-23 DIAGNOSIS — I10 Essential (primary) hypertension: Secondary | ICD-10-CM

## 2023-04-23 DIAGNOSIS — E119 Type 2 diabetes mellitus without complications: Secondary | ICD-10-CM

## 2023-04-23 DIAGNOSIS — F17200 Nicotine dependence, unspecified, uncomplicated: Secondary | ICD-10-CM

## 2023-04-23 DIAGNOSIS — E785 Hyperlipidemia, unspecified: Secondary | ICD-10-CM | POA: Diagnosis not present

## 2023-04-23 DIAGNOSIS — E1169 Type 2 diabetes mellitus with other specified complication: Secondary | ICD-10-CM

## 2023-04-23 DIAGNOSIS — Z7984 Long term (current) use of oral hypoglycemic drugs: Secondary | ICD-10-CM | POA: Diagnosis not present

## 2023-04-23 DIAGNOSIS — Z1231 Encounter for screening mammogram for malignant neoplasm of breast: Secondary | ICD-10-CM

## 2023-04-23 DIAGNOSIS — Z0000 Encounter for general adult medical examination without abnormal findings: Secondary | ICD-10-CM | POA: Diagnosis not present

## 2023-04-23 MED ORDER — PEG 3350-KCL-NABCB-NACL-NASULF 236 G PO SOLR: 4000.0000 mL | Freq: Once | ORAL | 0 refills | Status: DC

## 2023-04-23 MED ORDER — PAROXETINE HCL 40 MG PO TABS: 40.0000 mg | ORAL_TABLET | Freq: Every day | ORAL | 3 refills | Status: DC

## 2023-04-23 MED ORDER — METFORMIN HCL ER 500 MG PO TB24: 1000.0000 mg | ORAL_TABLET | Freq: Every day | ORAL | 3 refills | Status: DC

## 2023-04-23 MED ORDER — ATORVASTATIN CALCIUM 20 MG PO TABS: 20.0000 mg | ORAL_TABLET | Freq: Every day | ORAL | 3 refills | Status: DC

## 2023-04-23 MED ORDER — LOSARTAN POTASSIUM 100 MG PO TABS: 100.0000 mg | ORAL_TABLET | Freq: Every day | ORAL | 3 refills | Status: DC

## 2023-04-23 MED ORDER — PEG 3350-KCL-NABCB-NACL-NASULF 236 G PO SOLR: 4000.0000 mL | Freq: Once | ORAL | 0 refills | Status: AC

## 2023-04-23 NOTE — Assessment & Plan Note (Signed)
Due for screening colonoscopy in November Referral done to GI and # given to call and schedule that

## 2023-04-23 NOTE — Assessment & Plan Note (Addendum)
Lab Results  Component Value Date   HGBA1C 6.6 (H) 04/17/2023   Plan to continue working on low glycemic diet  Continue metformin xr 1000 mg daily  Pt is interested in GLP-1 Disc option of GLP medication including possible side effects like GI intolerance and risk of thyroid and endocrine cancer, pancreatitis and gallstones, kidney problems and diabetic retinopathy She was given info to read on semaglutide / will check with insurance and then call us back if interested  On arb and statin  Wound on bottom of right great toe is healed  Sent for dm eye exam recent

## 2023-04-23 NOTE — Progress Notes (Signed)
Subjective:    Patient ID: Kathleen Hernandez, female    DOB: Oct 17, 1955, 67 y.o.   MRN: [id number]  HPI  Here for health maintenance exam and to review chronic medical problems   Wt Readings from Last 3 Encounters:  04/23/23 211 lb 2 oz (95.8 kg)  02/14/23 215 lb 4 oz (97.6 kg)  11/24/22 215 lb 8 oz (97.8 kg)   34.60 kg/m  Vitals:   04/23/23 0849  BP: 118/68  Pulse: 60  Temp: 97.8 F (36.6 C)  SpO2: 95%    Immunization History  Administered Date(s) Administered   Fluad Quad(high Dose 65+) 04/29/2019   Influenza,inj,Quad PF,6+ Mos 08/23/2018   Influenza-Unspecified 05/28/2017   Pneumococcal Polysaccharide-23 10/08/2017   Tdap 10/08/2017    Health Maintenance Due  Topic Date Due   Lung Cancer Screening  Never done   Zoster Vaccines- Shingrix (1 of 2) Never done   Pneumonia Vaccine 11+ Years old (2 of 2 - PCV) 10/08/2018   FOOT EXAM  10/28/2022   Medicare Annual Wellness (AWV)  05/27/2023   Colonoscopy  06/21/2023   Shingrix-interested in later maybe  Has had shingles once   Pna vaccine - declines   Flu shot -declines   Lung cancer screening - declines   Mammogram 05/2022 - will order  Self breast exam- no lumps   Gyn health- none Has incontinence/urinary  Had sling in the past -no longer working   Smoking status - smokes 1ppd Not ready to quit   Colon cancer screening colonoscopy 06/2013   Bone health  Dexa  10/ 2023  osteopenia  Falls-none Fractures-none  Supplements -vitamin D  Exercise : mowing /yard work    Mood    04/23/2023    8:53 AM 11/24/2022    2:05 PM 05/26/2022    8:25 AM 04/19/2022    8:20 AM 11/24/2020   10:33 AM  Depression screen PHQ 2/9  Decreased Interest 0 3 0 0 1  Down, Depressed, Hopeless 0 0 0 0 1  PHQ - 2 Score 0 3 0 0 2  Altered sleeping 0 3   2  Tired, decreased energy 1 3   1   Change in appetite 0 1   0  Feeling bad or failure about yourself  0 0   0  Trouble concentrating 0 0   0  Moving slowly or  fidgety/restless 0 0   0  Suicidal thoughts 0 0   0  PHQ-9 Score 1 10   5   Difficult doing work/chores Not difficult at all Somewhat difficult   Somewhat difficult   Paxil 40 mg daily Wants to continue it   Ambien for sleep   Does not see derm Does not use sunscreen     HTN bp is stable today  No cp or palpitations or headaches or edema  No side effects to medicines  BP Readings from Last 3 Encounters:  04/23/23 118/68  04/10/23 116/70  02/14/23 100/70    Losartan 100 mg daily  Hydrochlorothiazide 25 mg daily   Pulse Readings from Last 3 Encounters:  04/23/23 60  04/10/23 64  02/14/23 75     DM2 Lab Results  Component Value Date   HGBA1C 6.6 (H) 04/17/2023   Thiis stable  Metformin xr 1000 mg daily  On arb and statin   Eye exam utd  Has exam on Thursday   Had wound of right great toe/blister Seen at UC last month  Treatment with augmentin   Interested  in GLP-1  Disc option of GLP medication including possible side effects like GI intolerance and risk of thyroid and endocrine cancer, pancreatitis and gallstones, kidney problems and diabetic retinopathy   Hyperlipidemia Lab Results  Component Value Date   CHOL 141 04/17/2023   CHOL 143 04/10/2022   CHOL 142 06/09/2021   Lab Results  Component Value Date   HDL 40.70 04/17/2023   HDL 44.80 04/10/2022   HDL 43.20 06/09/2021   Lab Results  Component Value Date   LDLCALC 72 04/17/2023   LDLCALC 74 04/10/2022   LDLCALC 74 06/09/2021   Lab Results  Component Value Date   TRIG 140.0 04/17/2023   TRIG 122.0 04/10/2022   TRIG 123.0 06/09/2021   Lab Results  Component Value Date   CHOLHDL 3 04/17/2023   CHOLHDL 3 04/10/2022   CHOLHDL 3 06/09/2021   No results found for: "LDLDIRECT" Atorvastatin 20 mg daily   The 10-year ASCVD risk score (Arnett DK, et al., 2019) is: 23.4%   Values used to calculate the score:     Age: 62 years     Sex: Female     Is Non-Hispanic African American: No      Diabetic: Yes     Tobacco smoker: Yes     Systolic Blood Pressure: 118 mmHg     Is BP treated: Yes     HDL Cholesterol: 40.7 mg/dL     Total Cholesterol: 141 mg/dL   Eating fair  Is mindful of sugar and fat    History of elevated wbc and plt due to smoking  Lab Results  Component Value Date   WBC 11.0 (H) 04/17/2023   HGB 12.9 04/17/2023   HCT 40.4 04/17/2023   MCV 91.6 04/17/2023   PLT 474.0 (H) 04/17/2023    Lab Results  Component Value Date   NA 139 04/17/2023   K 4.4 04/17/2023   CO2 31 04/17/2023   GLUCOSE 108 (H) 04/17/2023   BUN 16 04/17/2023   CREATININE 0.73 04/17/2023   CALCIUM 9.2 04/17/2023   GFR 85.35 04/17/2023   GFRNONAA >60 02/02/2021   Lab Results  Component Value Date   ALT 9 04/17/2023   AST 10 04/17/2023   ALKPHOS 112 04/17/2023   BILITOT 0.4 04/17/2023   Lab Results  Component Value Date   TSH 4.15 04/17/2023       Patient Active Problem List   Diagnosis Date Noted   Colon cancer screening 04/23/2023   Routine general medical examination at a health care facility 04/19/2022   Estrogen deficiency 04/19/2022   Low back pain 03/01/2021   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   Leukocytosis 08/09/2020   PVC (premature ventricular contraction) 07/21/2020   Knee pain, bilateral 02/10/2020   Mixed incontinence urge and stress 02/10/2020   Encounter for general adult medical examination with abnormal findings 08/05/2019   Type 2 diabetes mellitus without complications (HCC) 08/23/2018   Hyperlipidemia associated with type 2 diabetes mellitus (HCC) 08/31/2017   Smoker 08/31/2017   Hypertension 08/31/2017   Encounter for screening mammogram for breast cancer 08/31/2017   Class 2 obesity due to excess calories with body mass index (BMI) of 35.0 to 35.9 in adult 08/31/2017   Depression with anxiety 08/31/2017   Insomnia 08/31/2017   Past Medical History:  Diagnosis Date   Depression    Diabetes mellitus without  complication (HCC)    Family history of heart murmur    History of chickenpox    History of colon  polyps    History of depression    History of gastroesophageal reflux (GERD)    History of hyperlipidemia    History of urinary incontinence    Hyperlipidemia    Hypertension    Past Surgical History:  Procedure Laterality Date   BREAST BIOPSY Right 2000   neg   BREAST REDUCTION SURGERY  2010   GALLBLADDER SURGERY  2009   INCONTINENCE SURGERY  2009   PARTIAL HYSTERECTOMY  2009   REDUCTION MAMMAPLASTY Bilateral 2000   Social History   Tobacco Use   Smoking status: Every Day    Current packs/day: 1.00    Average packs/day: 1 pack/day for 41.0 years (41.0 ttl pk-yrs)    Types: Cigarettes   Smokeless tobacco: Never  Substance Use Topics   Alcohol use: No   Drug use: No   Family History  Problem Relation Age of Onset   Arthritis Mother    Heart attack Mother    Early death Mother    Alcohol abuse Father    Alzheimer's disease Father    Non-Hodgkin's lymphoma Sister    Depression Sister    Alcohol abuse Brother    COPD Brother    Heart attack Brother    Hyperlipidemia Brother    Diabetes Maternal Grandmother    Alcohol abuse Maternal Grandfather    Alzheimer's disease Paternal Grandmother    Drug abuse Son    Allergies  Allergen Reactions   Requip [Ropinirole Hcl] Other (See Comments)    Tongue swelling   Chantix [Varenicline] Rash   Current Outpatient Medications on File Prior to Visit  Medication Sig Dispense Refill   ACCU-CHEK FASTCLIX LANCETS MISC Check sugar once daily and as needed for diabetes. DX: E11.9 90 each 3   aspirin 325 MG tablet Take 325 mg by mouth daily.     Cholecalciferol (VITAMIN D3) 25 MCG (1000 UT) CAPS Take 1 capsule by mouth daily.     EPINEPHrine (EPIPEN 2-PAK) 0.3 mg/0.3 mL IJ SOAJ injection Inject 0.3 mg into the muscle as needed for anaphylaxis. 1 each 0   glucose blood (ACCU-CHEK GUIDE) test strip USE TO CHECK BLOOD SUGAR ONCE DAILY  AND AS NEEDED FOR DM (DX. E11.9) 100 strip 1   hydrochlorothiazide (HYDRODIURIL) 25 MG tablet TAKE 1 TABLET (25 MG TOTAL) BY MOUTH DAILY. 90 tablet 1   Omega-3 Fatty Acids (FISH OIL) 1200 MG CAPS Take 1 capsule by mouth daily.     vitamin E 400 UNIT capsule Take 400 Units by mouth daily.     zolpidem (AMBIEN) 10 MG tablet TAKE ONE TABLET BY MOUTH EVERY NIGHT AT BEDTIME AS NEEDED FOR SLEEP 30 tablet 3   celecoxib (CELEBREX) 200 MG capsule Take 1 capsule (200 mg total) by mouth daily. (Patient not taking: Reported on 04/23/2023) 30 capsule 2   No current facility-administered medications on file prior to visit.    Review of Systems  Constitutional:  Negative for activity change, appetite change, fatigue, fever and unexpected weight change.  HENT:  Negative for congestion, ear pain, rhinorrhea, sinus pressure and sore throat.   Eyes:  Negative for pain, redness and visual disturbance.  Respiratory:  Negative for cough, shortness of breath and wheezing.   Cardiovascular:  Negative for chest pain and palpitations.  Gastrointestinal:  Negative for abdominal pain, blood in stool, constipation and diarrhea.  Endocrine: Negative for polydipsia and polyuria.  Genitourinary:  Negative for dysuria, frequency and urgency.  Musculoskeletal:  Positive for arthralgias. Negative for back pain and  myalgias.  Skin:  Negative for pallor and rash.  Allergic/Immunologic: Negative for environmental allergies.  Neurological:  Negative for dizziness, syncope and headaches.  Hematological:  Negative for adenopathy. Does not bruise/bleed easily.  Psychiatric/Behavioral:  Negative for decreased concentration and dysphoric mood. The patient is not nervous/anxious.        Objective:   Physical Exam Constitutional:      General: She is not in acute distress.    Appearance: Normal appearance. She is well-developed. She is obese. She is not ill-appearing or diaphoretic.  HENT:     Head: Normocephalic and atraumatic.      Right Ear: Tympanic membrane, ear canal and external ear normal.     Left Ear: Tympanic membrane, ear canal and external ear normal.     Nose: Nose normal. No congestion.     Mouth/Throat:     Mouth: Mucous membranes are moist.     Pharynx: Oropharynx is clear. No posterior oropharyngeal erythema.  Eyes:     General: No scleral icterus.    Extraocular Movements: Extraocular movements intact.     Conjunctiva/sclera: Conjunctivae normal.     Pupils: Pupils are equal, round, and reactive to light.  Neck:     Thyroid: No thyromegaly.     Vascular: No carotid bruit or JVD.  Cardiovascular:     Rate and Rhythm: Normal rate and regular rhythm.     Pulses: Normal pulses.     Heart sounds: Normal heart sounds.     No gallop.  Pulmonary:     Effort: Pulmonary effort is normal. No respiratory distress.     Breath sounds: Normal breath sounds. No wheezing.     Comments: Diffusely distant bs  Chest:     Chest wall: No tenderness.  Abdominal:     General: Bowel sounds are normal. There is no distension or abdominal bruit.     Palpations: Abdomen is soft. There is no mass.     Tenderness: There is no abdominal tenderness.     Hernia: No hernia is present.  Genitourinary:    Comments: Breast exam: No mass, nodules, thickening, tenderness, bulging, retraction, inflamation, nipple discharge or skin changes noted.  No axillary or clavicular LA.     Musculoskeletal:        General: No tenderness. Normal range of motion.     Cervical back: Normal range of motion and neck supple. No rigidity. No muscular tenderness.     Right lower leg: No edema.     Left lower leg: No edema.     Comments: No kyphosis   Lymphadenopathy:     Cervical: No cervical adenopathy.  Skin:    General: Skin is warm and dry.     Coloration: Skin is not pale.     Findings: No erythema or rash.     Comments: Solar lentigines diffusely Tanned   Neurological:     Mental Status: She is alert. Mental status is at  baseline.     Cranial Nerves: No cranial nerve deficit.     Motor: No abnormal muscle tone.     Coordination: Coordination normal.     Gait: Gait normal.     Deep Tendon Reflexes: Reflexes are normal and symmetric. Reflexes normal.  Psychiatric:        Mood and Affect: Mood normal.        Cognition and Memory: Cognition and memory normal.           Assessment & Plan:   Problem List  Items Addressed This Visit       Cardiovascular and Mediastinum   Hypertension    bp in fair control at this time  BP Readings from Last 1 Encounters:  04/23/23 118/68   No changes needed Most recent labs reviewed  Disc lifstyle change with low sodium diet and exercise  Plan to continue hctz 25 mg daily  Losartan 100 mg daily        Relevant Medications   atorvastatin (LIPITOR) 20 MG tablet   losartan (COZAAR) 100 MG tablet     Endocrine   Hyperlipidemia associated with type 2 diabetes mellitus (HCC)    Disc goals for lipids and reasons to control them Rev last labs with pt Rev low sat fat diet in detail LDL of 72, controlled   Plan to continue atorvastatin 20 mg daily        Relevant Medications   atorvastatin (LIPITOR) 20 MG tablet   losartan (COZAAR) 100 MG tablet   metFORMIN (GLUCOPHAGE-XR) 500 MG 24 hr tablet   Type 2 diabetes mellitus without complications (HCC)    Lab Results  Component Value Date   HGBA1C 6.6 (H) 04/17/2023   Plan to continue working on low glycemic diet  Continue metformin xr 1000 mg daily  Pt is interested in GLP-1 Disc option of GLP medication including possible side effects like GI intolerance and risk of thyroid and endocrine cancer, pancreatitis and gallstones, kidney problems and diabetic retinopathy She was given info to read on semaglutide / will check with insurance and then call us back if interested  On arb and statin  Wound on bottom of right great toe is healed  Sent for dm eye exam recent       Relevant Medications   atorvastatin  (LIPITOR) 20 MG tablet   losartan (COZAAR) 100 MG tablet   metFORMIN (GLUCOPHAGE-XR) 500 MG 24 hr tablet     Other   Class 2 obesity due to excess calories with body mass index (BMI) of 35.0 to 35.9 in adult    Discussed how this problem influences overall health and the risks it imposes  Reviewed plan for weight loss with lower calorie diet (via better food choices (lower glycemic and portion control) along with exercise building up to or more than 30 minutes 5 days per week including some aerobic activity and strength training    Pt is intested in GLP-1 for dm and weight loss Info given      Relevant Medications   metFORMIN (GLUCOPHAGE-XR) 500 MG 24 hr tablet   Colon cancer screening    Due for screening colonoscopy in November Referral done to GI and # given to call and schedule that       Relevant Orders   Ambulatory referral to Gastroenterology   Depression with anxiety    Doing well with Paxil 40 mg daily and wants to continue this (does poorly without) Reviewed PHQ   Ambien for sleep with caution Last refill 8/12      Relevant Medications   PARoxetine (PAXIL) 40 MG tablet   Encounter for screening mammogram for breast cancer    Mammogram ordered at [facility] She will call to schedul      Relevant Orders   MM 3D SCREENING MAMMOGRAM BILATERAL BREAST   Leukocytosis    This is up and down  Lab Results  Component Value Date   WBC 11.0 (H) 04/17/2023   HGB 12.9 04/17/2023   HCT 40.4 04/17/2023   MCV 91.6 04/17/2023  PLT 474.0 (H) 04/17/2023   Plt up as well  Suspect due to smoking No new symptoms  Will continue to monitor Not interested in smoking cessation       Mixed incontinence urge and stress    Ref to urology  Has a sling- no longer working        Relevant Orders   Ambulatory referral to Urology   Routine general medical examination at a health care facility - Primary    Reviewed health habits including diet and exercise and skin cancer  prevention Reviewed appropriate screening tests for age  Also reviewed health mt list, fam hx and immunization status , as well as social and family history   See HPI Labs reviewed and ordered Discussed shingrix vaccine Declines flu and prevnar Declines lung cancer screening Declines smoking cessation  Mammogram ordered  Dexa utd/ no falls or fracture Encouraged exercise  PHQ1       Smoker    Disc in detail risks of smoking and possible outcomes including copd, vascular/ heart disease, cancer , respiratory and sinus infections  Pt voices understanding  Pt is not interested in quitting at this time

## 2023-04-23 NOTE — Assessment & Plan Note (Signed)
Discussed how this problem influences overall health and the risks it imposes  Reviewed plan for weight loss with lower calorie diet (via better food choices (lower glycemic and portion control) along with exercise building up to or more than 30 minutes 5 days per week including some aerobic activity and strength training    Pt is intested in GLP-1 for dm and weight loss Info given

## 2023-04-23 NOTE — Assessment & Plan Note (Signed)
Disc goals for lipids and reasons to control them Rev last labs with pt Rev low sat fat diet in detail LDL of 72, controlled   Plan to continue atorvastatin 20 mg daily

## 2023-04-23 NOTE — Telephone Encounter (Signed)
Pt requesting call back to schedule colonoscopy.

## 2023-04-23 NOTE — Assessment & Plan Note (Signed)
Mammogram ordered at [facility] She will call to schedul

## 2023-04-23 NOTE — Assessment & Plan Note (Signed)
Ref to urology  Has a sling- no longer working

## 2023-04-23 NOTE — Assessment & Plan Note (Signed)
bp in fair control at this time  BP Readings from Last 1 Encounters:  04/23/23 118/68   No changes needed Most recent labs reviewed  Disc lifstyle change with low sodium diet and exercise  Plan to continue hctz 25 mg daily  Losartan 100 mg daily

## 2023-04-23 NOTE — Telephone Encounter (Signed)
Colonoscopy schedule on 06/25/2023 with Dr Allegra Lai at [facility]

## 2023-04-23 NOTE — Telephone Encounter (Signed)
Gastroenterology Pre-Procedure Review  Request Date: 06/25/2023 Requesting Physician: Dr. Allegra Lai  PATIENT REVIEW QUESTIONS: The patient responded to the following health history questions as indicated:    1. Are you having any GI issues? no 2. Do you have a personal history of Polyps? no 3. Do you have a family history of Colon Cancer or Polyps? no 4. Diabetes Mellitus? yes (taking metformin) 5. Joint replacements in the past 12 months?no 6. Major health problems in the past 3 months?no 7. Any artificial heart valves, MVP, or defibrillator?no    MEDICATIONS & ALLERGIES:    Patient reports the following regarding taking any anticoagulation/antiplatelet therapy:   Plavix, Coumadin, Eliquis, Xarelto, Lovenox, Pradaxa, Brilinta, or Effient? no Aspirin? yes (325 mg)  Patient confirms/reports the following medications:  Current Outpatient Medications  Medication Sig Dispense Refill   ACCU-CHEK FASTCLIX LANCETS MISC Check sugar once daily and as needed for diabetes. DX: E11.9 90 each 3   aspirin 325 MG tablet Take 325 mg by mouth daily.     atorvastatin (LIPITOR) 20 MG tablet Take 1 tablet (20 mg total) by mouth daily. 90 tablet 3   celecoxib (CELEBREX) 200 MG capsule Take 1 capsule (200 mg total) by mouth daily. (Patient not taking: Reported on 04/23/2023) 30 capsule 2   Cholecalciferol (VITAMIN D3) 25 MCG (1000 UT) CAPS Take 1 capsule by mouth daily.     EPINEPHrine (EPIPEN 2-PAK) 0.3 mg/0.3 mL IJ SOAJ injection Inject 0.3 mg into the muscle as needed for anaphylaxis. 1 each 0   glucose blood (ACCU-CHEK GUIDE) test strip USE TO CHECK BLOOD SUGAR ONCE DAILY AND AS NEEDED FOR DM (DX. E11.9) 100 strip 1   hydrochlorothiazide (HYDRODIURIL) 25 MG tablet TAKE 1 TABLET (25 MG TOTAL) BY MOUTH DAILY. 90 tablet 1   losartan (COZAAR) 100 MG tablet Take 1 tablet (100 mg total) by mouth daily. 90 tablet 3   metFORMIN (GLUCOPHAGE-XR) 500 MG 24 hr tablet Take 2 tablets (1,000 mg total) by mouth daily with  breakfast. 180 tablet 3   Omega-3 Fatty Acids (FISH OIL) 1200 MG CAPS Take 1 capsule by mouth daily.     PARoxetine (PAXIL) 40 MG tablet Take 1 tablet (40 mg total) by mouth daily. 90 tablet 3   polyethylene glycol (GOLYTELY) 236 g solution Take 4,000 mLs by mouth once for 1 dose. 4000 mL 0   vitamin E 400 UNIT capsule Take 400 Units by mouth daily.     zolpidem (AMBIEN) 10 MG tablet TAKE ONE TABLET BY MOUTH EVERY NIGHT AT BEDTIME AS NEEDED FOR SLEEP 30 tablet 3   No current facility-administered medications for this visit.    Patient confirms/reports the following allergies:  Allergies  Allergen Reactions   Requip [Ropinirole Hcl] Other (See Comments)    Tongue swelling   Chantix [Varenicline] Rash    No orders of the defined types were placed in this encounter.   AUTHORIZATION INFORMATION Primary Insurance: 1D#: Group #:  Secondary Insurance: 1D#: Group #:  SCHEDULE INFORMATION: Date: 06/25/2023 Time: Location: [facility]

## 2023-04-23 NOTE — Assessment & Plan Note (Signed)
This is up and down  Lab Results  Component Value Date   WBC 11.0 (H) 04/17/2023   HGB 12.9 04/17/2023   HCT 40.4 04/17/2023   MCV 91.6 04/17/2023   PLT 474.0 (H) 04/17/2023   Plt up as well  Suspect due to smoking No new symptoms  Will continue to monitor Not interested in smoking cessation

## 2023-04-23 NOTE — Assessment & Plan Note (Signed)
Disc in detail risks of smoking and possible outcomes including copd, vascular/ heart disease, cancer , respiratory and sinus infections  Pt voices understanding Pt is not interested in quitting at this time 

## 2023-04-23 NOTE — Assessment & Plan Note (Signed)
Reviewed health habits including diet and exercise and skin cancer prevention Reviewed appropriate screening tests for age  Also reviewed health mt list, fam hx and immunization status , as well as social and family history   See HPI Labs reviewed and ordered Discussed shingrix vaccine Declines flu and prevnar Declines lung cancer screening Declines smoking cessation  Mammogram ordered  Dexa utd/ no falls or fracture Encouraged exercise  PHQ1

## 2023-04-23 NOTE — Patient Instructions (Addendum)
If you are interested in the new shingles vaccine (Shingrix) - call your local pharmacy to check on coverage and availability  If affordable, get on a wait list at your pharmacy to get the vaccine.   Call to schedule your colonoscopy  [facility]  [phone]   You need a plan for exercise when you are not doing yard work  Walk/ bike /swim - to get heart rate up .Add some strength training to your routine, this is important for bone and brain health and can reduce your risk of falls and help your body use insulin properly and regulate weight  Light weights, exercise bands , and internet videos are a good way to start  Yoga (chair or regular), machines , floor exercises or a gym with machines are also good options    Read about the generic of ozempic (semaglutide)  GLP-1   (several brands)  Call us and let us know if you want to start it  Also check with your insurance   I put the referral in for urology  Please let us know if you don't hear in 1-2 weeks     If you change your mind about flu or pneumonia vaccines let us know    You have an order for:  []   2D Mammogram  [x]   3D Mammogram  []   Bone Density     Please call for appointment:   [x]   [facility]  [address] [place] [address]  [phone]  []   [facility])   [address]  [place], [place] [address]  [phone]  []   [facility]      [address] [place], [place]        [phone]         []   [facility]  [address] [place], [place]  [phone]  []  [facility]   [address]   [place], [place] [address]  [phone]  []  [facility]  [address] [place], [place] [address] [phone]    Make sure to wear two  piece clothing  No lotions powders or deodorants the day of the appointment Make sure to bring picture ID and insurance card.  Bring list of medications you are currently taking including any supplements.   Schedule your screening mammogram through MyChart!   Select  imaging sites can now be scheduled through MyChart.  Log into your MyChart account.  Go to 'Visit' (or 'Appointments' if  on mobile App) --> Schedule an  Appointment  Under 'Select a Reason for Visit' choose the Mammogram  Screening option.  Complete the pre-visit questions  and select the time and place that  best fits your schedule

## 2023-04-23 NOTE — Telephone Encounter (Signed)
Not sure why transmission failed on Rx Golytely. Tried resending 4 times. I have called [facility] to make sure they received the Rx and they did, will fill Rx for patient.

## 2023-04-23 NOTE — Assessment & Plan Note (Signed)
Doing well with Paxil 40 mg daily and wants to continue this (does poorly without) Reviewed PHQ   Ambien for sleep with caution Last refill 8/12

## 2023-04-24 ENCOUNTER — Telehealth: Payer: Self-pay | Admitting: Family Medicine

## 2023-04-24 MED ORDER — OZEMPIC (0.25 OR 0.5 MG/DOSE) 2 MG/3ML ~~LOC~~ SOPN: 0.2500 mg | PEN_INJECTOR | SUBCUTANEOUS | 0 refills | Status: DC

## 2023-04-24 NOTE — Telephone Encounter (Signed)
Pt called stating she saw Dr. Milinda Antis on yesterday, 9/9, for her cpe & was told to let Dr. Milinda Antis know if she wanted to proceed with starting Ozempic. Pt states she does want to start meds. Call back # [phone]

## 2023-04-24 NOTE — Addendum Note (Signed)
Addended by: Roxy Manns A on: 04/24/2023 08:20 PM   Modules accepted: Orders

## 2023-04-24 NOTE — Telephone Encounter (Signed)
[facility] We will see if insurance covers (or if it covers a different brand)  Also see if available  Will no doubt need prior auth (is diabetic on metformin)   Let me know how it goes and if she ends up taking it let me know 3 weeks later how she is doing (tolerance and blood sugar wise)   Thanks

## 2023-04-26 NOTE — Telephone Encounter (Signed)
Called patient was able to pick up yesterday will start in a few days. She will call office and give update in 3 weeks.

## 2023-05-14 ENCOUNTER — Other Ambulatory Visit: Payer: Self-pay | Admitting: Family Medicine

## 2023-05-14 NOTE — Telephone Encounter (Signed)
Last filled on 04/24/23 #3 mL/ 0 refills  CPE was 04/23/23

## 2023-05-14 NOTE — Telephone Encounter (Signed)
I sent it  Please follow up with me in 3-4 weeks to check in  We may continue dose from there

## 2023-05-14 NOTE — Telephone Encounter (Signed)
Pt said she is doing well on dose with no major side eff or GI sxs. Pt does want to increase dose of med for next refill

## 2023-05-14 NOTE — Telephone Encounter (Signed)
How is she tolerating it? Does she want to go up on dose?

## 2023-05-15 NOTE — Telephone Encounter (Signed)
Lvm for patient tcb and schedule 

## 2023-05-31 ENCOUNTER — Telehealth: Payer: Self-pay | Admitting: *Deleted

## 2023-05-31 NOTE — Telephone Encounter (Signed)
Received clearance from patient's current PCP.  Patient have been taking 325 mg aspirin from her previous PCP.  PCP - Dr Milinda Antis have cleared patient to have procedure. However, recommended holding of aspirin at GI provider's discretion.  Additional Notes:  Risk is increased for general anesthesia due to smoking status (be aware)  Please advise. Colonoscopy is schedule on 06/25/2023.

## 2023-05-31 NOTE — Telephone Encounter (Signed)
Please hold aspirin 325 mg for 5 days before colonoscopy  RV

## 2023-05-31 NOTE — Telephone Encounter (Signed)
Noted. Will call patient on Tuesday, 06/19/2023

## 2023-06-01 ENCOUNTER — Ambulatory Visit
Admission: RE | Admit: 2023-06-01 | Discharge: 2023-06-01 | Disposition: A | Discharge status: Home / Self Care | Payer: Medicare HMO | Source: Ambulatory Visit | Attending: Family Medicine | Admitting: Family Medicine

## 2023-06-01 DIAGNOSIS — Z1231 Encounter for screening mammogram for malignant neoplasm of breast: Secondary | ICD-10-CM | POA: Insufficient documentation

## 2023-06-09 ENCOUNTER — Other Ambulatory Visit: Payer: Self-pay | Admitting: Family Medicine

## 2023-06-11 NOTE — Telephone Encounter (Signed)
Last filled on 05/14/23 # 3 mL/ 0 refills, CPE was on 04/23/23

## 2023-06-11 NOTE — Telephone Encounter (Signed)
How is she doing with this ? Does she want to go up on dose now to 1 mg ?

## 2023-06-11 NOTE — Telephone Encounter (Signed)
Left VM requesting pt to call the office back 

## 2023-06-18 NOTE — Telephone Encounter (Signed)
Pt notified Rx sent to pharmacy and advised of Dr. Marliss Coots comments and instructions

## 2023-06-18 NOTE — Telephone Encounter (Signed)
Called and spoke with patient she stated she is doing great on Ozempic and would ike to increase dose to 1mg .

## 2023-06-18 NOTE — Telephone Encounter (Signed)
Great I sent the 1 mg dose  If any side effects let us know   Call or message to update in 3 weeks so we know if we need to go up further  Thanks

## 2023-06-19 NOTE — Telephone Encounter (Signed)
Spoken to patient and notified her to stop her aspirin

## 2023-06-25 ENCOUNTER — Encounter: Payer: Self-pay | Admitting: Gastroenterology

## 2023-06-25 ENCOUNTER — Ambulatory Visit: Payer: Medicare HMO | Admitting: General Practice

## 2023-06-25 ENCOUNTER — Encounter
Admission: RE | Disposition: A | Discharge status: Home / Self Care | Payer: Self-pay | Source: Home / Self Care | Attending: Gastroenterology

## 2023-06-25 ENCOUNTER — Other Ambulatory Visit: Payer: Self-pay

## 2023-06-25 ENCOUNTER — Ambulatory Visit
Admission: RE | Admit: 2023-06-25 | Discharge: 2023-06-25 | Disposition: A | Discharge status: Home / Self Care | Payer: Medicare HMO | Attending: Gastroenterology | Admitting: Gastroenterology

## 2023-06-25 DIAGNOSIS — D125 Benign neoplasm of sigmoid colon: Secondary | ICD-10-CM | POA: Diagnosis not present

## 2023-06-25 DIAGNOSIS — K635 Polyp of colon: Secondary | ICD-10-CM

## 2023-06-25 DIAGNOSIS — K5730 Diverticulosis of large intestine without perforation or abscess without bleeding: Secondary | ICD-10-CM | POA: Insufficient documentation

## 2023-06-25 DIAGNOSIS — Z1211 Encounter for screening for malignant neoplasm of colon: Secondary | ICD-10-CM | POA: Insufficient documentation

## 2023-06-25 DIAGNOSIS — E119 Type 2 diabetes mellitus without complications: Secondary | ICD-10-CM | POA: Diagnosis not present

## 2023-06-25 DIAGNOSIS — I10 Essential (primary) hypertension: Secondary | ICD-10-CM | POA: Diagnosis not present

## 2023-06-25 DIAGNOSIS — F17210 Nicotine dependence, cigarettes, uncomplicated: Secondary | ICD-10-CM | POA: Insufficient documentation

## 2023-06-25 DIAGNOSIS — D124 Benign neoplasm of descending colon: Secondary | ICD-10-CM | POA: Insufficient documentation

## 2023-06-25 HISTORY — PX: POLYPECTOMY: SHX5525

## 2023-06-25 HISTORY — PX: COLONOSCOPY WITH PROPOFOL: SHX5780

## 2023-06-25 LAB — GLUCOSE, CAPILLARY: Glucose-Capillary: 83 mg/dL (ref 70–99)

## 2023-06-25 SURGERY — COLONOSCOPY WITH PROPOFOL
Anesthesia: General

## 2023-06-25 MED ORDER — SODIUM CHLORIDE 0.9 % IV SOLN: INTRAVENOUS | Status: DC

## 2023-06-25 MED ORDER — PROPOFOL 10 MG/ML IV BOLUS
INTRAVENOUS | Status: DC | PRN
  Administered 2023-06-25: 150 ug/kg/min via INTRAVENOUS
  Administered 2023-06-25: 80 mg via INTRAVENOUS

## 2023-06-25 MED ORDER — PROPOFOL 1000 MG/100ML IV EMUL
INTRAVENOUS | Status: AC
  Filled 2023-06-25: qty 100

## 2023-06-25 MED ORDER — LIDOCAINE HCL (PF) 2 % IJ SOLN
INTRAMUSCULAR | Status: AC
  Filled 2023-06-25: qty 5

## 2023-06-25 MED ORDER — LIDOCAINE HCL (PF) 2 % IJ SOLN
INTRAMUSCULAR | Status: DC | PRN
Start: 2023-06-25 — End: 2023-06-25
  Administered 2023-06-25: 50 mg via INTRADERMAL

## 2023-06-25 NOTE — Anesthesia Procedure Notes (Signed)
Date/Time: 06/25/2023 7:55 AM  Performed by: Ginger Carne, CRNAPre-anesthesia Checklist: Patient identified, Emergency Drugs available, Suction available and Patient being monitored Patient Re-evaluated:Patient Re-evaluated prior to induction Oxygen Delivery Method: Nasal cannula Preoxygenation: Pre-oxygenation with 100% oxygen Induction Type: IV induction

## 2023-06-25 NOTE — H&P (Signed)
Arlyss Repress, MD [address]  [address]  [place], [place] [address]  Main: [phone]  Fax: [phone] Pager: [phone]  Primary Care Physician:  Tower, Audrie Gallus, MD Primary Gastroenterologist:  Dr. Arlyss Repress  Pre-Procedure History & Physical: HPI:  Kathleen Hernandez is a 67 y.o. female is here for an colonoscopy.   Past Medical History:  Diagnosis Date   Depression    Diabetes mellitus without complication (HCC)    Family history of heart murmur    History of chickenpox    History of colon polyps    History of depression    History of gastroesophageal reflux (GERD)    History of hyperlipidemia    History of urinary incontinence    Hyperlipidemia    Hypertension     Past Surgical History:  Procedure Laterality Date   BREAST BIOPSY Right 2000   neg   BREAST REDUCTION SURGERY  2010   GALLBLADDER SURGERY  2009   INCONTINENCE SURGERY  2009   PARTIAL HYSTERECTOMY  2009   REDUCTION MAMMAPLASTY Bilateral 2000    Prior to Admission medications   Medication Sig Start Date End Date Taking? Authorizing Provider  aspirin 325 MG tablet Take 325 mg by mouth daily.   Yes [provider]  atorvastatin (LIPITOR) 20 MG tablet Take 1 tablet (20 mg total) by mouth daily. 04/23/23  Yes Tower, Audrie Gallus, MD  Cholecalciferol (VITAMIN D3) 25 MCG (1000 UT) CAPS Take 1 capsule by mouth daily.   Yes [provider]  hydrochlorothiazide (HYDRODIURIL) 25 MG tablet TAKE 1 TABLET (25 MG TOTAL) BY MOUTH DAILY. 06/14/22  Yes Tower, Audrie Gallus, MD  losartan (COZAAR) 100 MG tablet Take 1 tablet (100 mg total) by mouth daily. 04/23/23  Yes Tower, Audrie Gallus, MD  metFORMIN (GLUCOPHAGE-XR) 500 MG 24 hr tablet Take 2 tablets (1,000 mg total) by mouth daily with breakfast. 04/23/23  Yes Tower, Audrie Gallus, MD  Omega-3 Fatty Acids (FISH OIL) 1200 MG CAPS Take 1 capsule by mouth daily.   Yes [provider]  PARoxetine (PAXIL) 40 MG tablet Take 1 tablet (40 mg total) by mouth  daily. 04/23/23  Yes Tower, Audrie Gallus, MD  Semaglutide, 1 MG/DOSE, 4 MG/3ML SOPN Inject 1 mg as directed once a week. 06/18/23  Yes Tower, Audrie Gallus, MD  vitamin E 400 UNIT capsule Take 400 Units by mouth daily.   Yes [provider]  zolpidem (AMBIEN) 10 MG tablet TAKE ONE TABLET BY MOUTH EVERY NIGHT AT BEDTIME AS NEEDED FOR SLEEP 02/13/23  Yes Tower, Audrie Gallus, MD  ACCU-CHEK FASTCLIX LANCETS MISC Check sugar once daily and as needed for diabetes. DX: E11.9 09/13/18   Tower, Audrie Gallus, MD  celecoxib (CELEBREX) 200 MG capsule Take 1 capsule (200 mg total) by mouth daily. Patient not taking: Reported on 04/23/2023 02/14/23   Copland, Karleen Hampshire, MD  EPINEPHrine (EPIPEN 2-PAK) 0.3 mg/0.3 mL IJ SOAJ injection Inject 0.3 mg into the muscle as needed for anaphylaxis. 02/03/21   Dionne Bucy, MD  glucose blood (ACCU-CHEK GUIDE) test strip USE TO CHECK BLOOD SUGAR ONCE DAILY AND AS NEEDED FOR DM (DX. E11.9) 09/22/19   Judy Pimple, MD    Allergies as of 04/23/2023 - Review Complete 04/23/2023  Allergen Reaction Noted   Requip [ropinirole hcl] Other (See Comments) 08/31/2017   Chantix [varenicline] Rash 08/31/2017    Family History  Problem Relation Age of Onset   Arthritis Mother    Heart attack Mother  Early death Mother    Alcohol abuse Father    Alzheimer's disease Father    Non-Hodgkin's lymphoma Sister    Depression Sister    Diabetes Maternal Grandmother    Alcohol abuse Maternal Grandfather    Alzheimer's disease Paternal Grandmother    Alcohol abuse Brother    COPD Brother    Heart attack Brother    Hyperlipidemia Brother    Drug abuse Son    Breast cancer Neg Hx     Social History   Socioeconomic History   Marital status: Widowed    Spouse name: Not on file   Number of children: Not on file   Years of education: Not on file   Highest education level: Not on file  Occupational History   Not on file  Tobacco Use   Smoking status: Every Day    Current packs/day: 1.00     Average packs/day: 1 pack/day for 41.0 years (41.0 ttl pk-yrs)    Types: Cigarettes   Smokeless tobacco: Never  Vaping Use   Vaping status: Never Used  Substance and Sexual Activity   Alcohol use: No   Drug use: No   Sexual activity: Not on file  Other Topics Concern   Not on file  Social History Narrative   Lives at home with two grandchildren   Social Determinants of Health   Financial Resource Strain: Low Risk  (05/26/2022)   Overall Financial Resource Strain (CARDIA)    Difficulty of Paying Living Expenses: Not hard at all  Food Insecurity: No Food Insecurity (05/26/2022)   Hunger Vital Sign    Worried About Running Out of Food in the Last Year: Never true    Ran Out of Food in the Last Year: Never true  Transportation Needs: No Transportation Needs (05/26/2022)   PRAPARE - Administrator, Civil Service (Medical): No    Lack of Transportation (Non-Medical): No  Physical Activity: Inactive (05/26/2022)   Exercise Vital Sign    Days of Exercise per Week: 0 days    Minutes of Exercise per Session: 0 min  Stress: No Stress Concern Present (05/26/2022)   [employer] of Occupational Health - Occupational Stress Questionnaire    Feeling of Stress : Not at all  Social Connections: Socially Isolated (05/26/2022)   Social Connection and Isolation Panel [NHANES]    Frequency of Communication with Friends and Family: More than three times a week    Frequency of Social Gatherings with Friends and Family: More than three times a week    Attends Religious Services: Never    Database administrator or Organizations: No    Attends Banker Meetings: Never    Marital Status: Widowed  Intimate Partner Violence: Not At Risk (05/26/2022)   Humiliation, Afraid, Rape, and Kick questionnaire    Fear of Current or Ex-Partner: No    Emotionally Abused: No    Physically Abused: No    Sexually Abused: No    Review of Systems: See HPI, otherwise negative  ROS  Physical Exam: BP 130/63   Pulse 67   Temp (!) 97.1 F (36.2 C) (Oral)   Resp 18   Ht 5\' 5"  (1.651 m)   Wt 91.5 kg   SpO2 98%   BMI 33.58 kg/m  General:   Alert,  pleasant and cooperative in NAD Head:  Normocephalic and atraumatic. Neck:  Supple; no masses or thyromegaly. Lungs:  Clear throughout to auscultation.    Heart:  Regular rate  and rhythm. Abdomen:  Soft, nontender and nondistended. Normal bowel sounds, without guarding, and without rebound.   Neurologic:  Alert and  oriented x4;  grossly normal neurologically.  Impression/Plan: ANGELLYNN PIMM is here for an colonoscopy to be performed for colon cancer screening  Risks, benefits, limitations, and alternatives regarding  colonoscopy have been reviewed with the patient.  Questions have been answered.  All parties agreeable.   Lannette Donath, MD  06/25/2023, 7:47 AM

## 2023-06-25 NOTE — Anesthesia Postprocedure Evaluation (Signed)
Anesthesia Post Note  Patient: Kathleen Hernandez  Procedure(s) Performed: COLONOSCOPY WITH PROPOFOL POLYPECTOMY  Patient location during evaluation: PACU Anesthesia Type: General Level of consciousness: awake and alert Pain management: pain level controlled Vital Signs Assessment: post-procedure vital signs reviewed and stable Respiratory status: spontaneous breathing, nonlabored ventilation, respiratory function stable and patient connected to nasal cannula oxygen Cardiovascular status: blood pressure returned to baseline and stable Postop Assessment: no apparent nausea or vomiting Anesthetic complications: no  No notable events documented.   Last Vitals:  Vitals:   06/25/23 0817 06/25/23 0827  BP: (!) 91/56 102/72  Pulse: 77 73  Resp: 15   Temp: (!) 35.6 C   SpO2: 97% 97%    Last Pain:  Vitals:   06/25/23 0827  TempSrc:   PainSc: 0-No pain                 Stephanie Coup

## 2023-06-25 NOTE — Anesthesia Preprocedure Evaluation (Signed)
Anesthesia Evaluation  Patient identified by MRN, date of birth, ID band Patient awake    Reviewed: Allergy & Precautions, NPO status , Patient's Chart, lab work & pertinent test results  Airway Mallampati: III  TM Distance: >3 FB Neck ROM: full    Dental  (+) Chipped   Pulmonary neg pulmonary ROS, Current Smoker   Pulmonary exam normal        Cardiovascular hypertension, On Medications negative cardio ROS Normal cardiovascular exam     Neuro/Psych  PSYCHIATRIC DISORDERS      negative neurological ROS     GI/Hepatic negative GI ROS, Neg liver ROS,,,  Endo/Other  negative endocrine ROSdiabetes    Renal/GU negative Renal ROS  negative genitourinary   Musculoskeletal   Abdominal   Peds  Hematology negative hematology ROS (+)   Anesthesia Other Findings Past Medical History: No date: Depression No date: Diabetes mellitus without complication (HCC) No date: Family history of heart murmur No date: History of chickenpox No date: History of colon polyps No date: History of depression No date: History of gastroesophageal reflux (GERD) No date: History of hyperlipidemia No date: History of urinary incontinence No date: Hyperlipidemia No date: Hypertension  Past Surgical History: 2000: BREAST BIOPSY; Right     Comment:  neg 2010: BREAST REDUCTION SURGERY 2009: GALLBLADDER SURGERY 2009: INCONTINENCE SURGERY 2009: PARTIAL HYSTERECTOMY 2000: REDUCTION MAMMAPLASTY; Bilateral  BMI    Body Mass Index: 33.58 kg/m      Reproductive/Obstetrics negative OB ROS                             Anesthesia Physical Anesthesia Plan  ASA: 2  Anesthesia Plan: General   Post-op Pain Management: Minimal or no pain anticipated   Induction: Intravenous  PONV Risk Score and Plan: 3 and Propofol infusion, TIVA and Ondansetron  Airway Management Planned: Nasal Cannula  Additional Equipment:  None  Intra-op Plan:   Post-operative Plan:   Informed Consent: I have reviewed the patients History and Physical, chart, labs and discussed the procedure including the risks, benefits and alternatives for the proposed anesthesia with the patient or authorized representative who has indicated his/her understanding and acceptance.     Dental advisory given  Plan Discussed with: CRNA and Surgeon  Anesthesia Plan Comments: (Discussed risks of anesthesia with patient, including possibility of difficulty with spontaneous ventilation under anesthesia necessitating airway intervention, PONV, and rare risks such as cardiac or respiratory or neurological events, and allergic reactions. Discussed the role of CRNA in patient's perioperative care. Patient understands.)       Anesthesia Quick Evaluation

## 2023-06-25 NOTE — Transfer of Care (Signed)
Immediate Anesthesia Transfer of Care Note  Patient: Kathleen Hernandez  Procedure(s) Performed: COLONOSCOPY WITH PROPOFOL POLYPECTOMY  Patient Location: Endoscopy Unit  Anesthesia Type:General  Level of Consciousness: awake and drowsy  Airway & Oxygen Therapy: Patient Spontanous Breathing  Post-op Assessment: Report given to RN and Post -op Vital signs reviewed and stable  Post vital signs: Reviewed and stable  Last Vitals:  Vitals Value Taken Time  BP 91/56 06/25/23 0817  Temp 35.6 C 06/25/23 0817  Pulse 77 06/25/23 0817  Resp 15 06/25/23 0817  SpO2 97 % 06/25/23 0817    Last Pain:  Vitals:   06/25/23 0817  TempSrc: Temporal  PainSc: Asleep         Complications: No notable events documented.

## 2023-06-25 NOTE — Op Note (Signed)
[facility] Patient Name: Kathleen Hernandez Procedure Date: 06/25/2023 7:27 AM MRN: [id number] Account #: [id number] Date of Birth: 05-13-1956 Admit Type: Outpatient Age: 67 Room: [facility] ENDO ROOM 4 Gender: Female Note Status: Finalized Instrument Name: Prentice Docker [id number] Procedure:             Colonoscopy Indications:           Screening for colorectal malignant neoplasm, Last                         colonoscopy 10 years ago Providers:             Toney Reil MD, MD Referring MD:          Audrie Gallus. Tower (Referring MD) Medicines:             General Anesthesia Complications:         No immediate complications. Estimated blood loss: None. Procedure:             Pre-Anesthesia Assessment:                        - Prior to the procedure, a History and Physical was                         performed, and patient medications and allergies were                         reviewed. The patient is competent. The risks and                         benefits of the procedure and the sedation options and                         risks were discussed with the patient. All questions                         were answered and informed consent was obtained.                         Patient identification and proposed procedure were                         verified by the physician, the nurse, the                         anesthesiologist, the anesthetist and the technician                         in the pre-procedure area in the procedure room in the                         endoscopy suite. Mental Status Examination: alert and                         oriented. Airway Examination: normal oropharyngeal                         airway and neck mobility. Respiratory Examination:  clear to auscultation. CV Examination: normal.                         Prophylactic Antibiotics: The patient does not require                         prophylactic  antibiotics. Prior Anticoagulants: The                         patient has taken no anticoagulant or antiplatelet                         agents. ASA Grade Assessment: II - A patient with mild                         systemic disease. After reviewing the risks and                         benefits, the patient was deemed in satisfactory                         condition to undergo the procedure. The anesthesia                         plan was to use general anesthesia. Immediately prior                         to administration of medications, the patient was                         re-assessed for adequacy to receive sedatives. The                         heart rate, respiratory rate, oxygen saturations,                         blood pressure, adequacy of pulmonary ventilation, and                         response to care were monitored throughout the                         procedure. The physical status of the patient was                         re-assessed after the procedure.                        After obtaining informed consent, the colonoscope was                         passed under direct vision. Throughout the procedure,                         the patient's blood pressure, pulse, and oxygen                         saturations were monitored continuously. The  Colonoscope was introduced through the anus and                         advanced to the the terminal ileum, with                         identification of the appendiceal orifice and IC                         valve. The colonoscopy was performed without                         difficulty. The patient tolerated the procedure well.                         The quality of the bowel preparation was evaluated                         using the BBPS [facility] Bowel Preparation Scale) with                         scores of: Right Colon = 3, Transverse Colon = 3 and                         Left Colon = 3 (entire  mucosa seen well with no                         residual staining, small fragments of stool or opaque                         liquid). The total BBPS score equals 9. The terminal                         ileum, ileocecal valve, appendiceal orifice, and                         rectum were photographed. Findings:      The perianal and digital rectal examinations were normal. Pertinent       negatives include normal sphincter tone and no palpable rectal lesions.      Three sessile polyps were found in the sigmoid colon and descending       colon. The polyps were 5 to 6 mm in size. These polyps were removed with       a cold snare. Resection and retrieval were complete. Estimated blood       loss: none.      Multiple small-mouthed diverticula were found in the sigmoid colon.      The retroflexed view of the distal rectum and anal verge was normal and       showed no anal or rectal abnormalities.      The terminal ileum appeared normal. Impression:            - Three 5 to 6 mm polyps in the sigmoid colon and in                         the descending colon, removed with a cold snare.  Resected and retrieved.                        - Diverticulosis in the sigmoid colon.                        - The distal rectum and anal verge are normal on                         retroflexion view.                        - The examined portion of the ileum was normal. Recommendation:        - Discharge patient to home (with escort).                        - Resume previous diet today.                        - Continue present medications.                        - Await pathology results.                        - Repeat colonoscopy in 3 - 5 years for surveillance                         based on pathology results. Procedure Code(s):     --- Professional ---                        [phone], Colonoscopy, flexible; with removal of                         tumor(s), polyp(s), or other lesion(s)  by snare                         technique Diagnosis Code(s):     --- Professional ---                        Z12.11, Encounter for screening for malignant neoplasm                         of colon                        D12.5, Benign neoplasm of sigmoid colon                        D12.4, Benign neoplasm of descending colon                        K57.30, Diverticulosis of large intestine without                         perforation or abscess without bleeding CPT copyright 2022 American Medical Association. All rights reserved. The codes documented in this report are preliminary and upon coder review may  be revised to meet current compliance requirements. Dr. Libby Maw Toney Reil MD, MD 06/25/2023 8:17:44 AM This report  has been signed electronically. Number of Addenda: 0 Note Initiated On: 06/25/2023 7:27 AM Scope Withdrawal Time: 0 hours 10 minutes 55 seconds  Total Procedure Duration: 0 hours 13 minutes 56 seconds  Estimated Blood Loss:  Estimated blood loss: none.      [facility]

## 2023-06-26 LAB — SURGICAL PATHOLOGY

## 2023-06-29 ENCOUNTER — Encounter: Payer: Self-pay | Admitting: Gastroenterology

## 2023-07-02 ENCOUNTER — Ambulatory Visit: Payer: Medicare HMO | Admitting: Urology

## 2023-07-05 DIAGNOSIS — H524 Presbyopia: Secondary | ICD-10-CM | POA: Diagnosis not present

## 2023-07-05 DIAGNOSIS — H52223 Regular astigmatism, bilateral: Secondary | ICD-10-CM | POA: Diagnosis not present

## 2023-07-05 DIAGNOSIS — Z135 Encounter for screening for eye and ear disorders: Secondary | ICD-10-CM | POA: Diagnosis not present

## 2023-07-13 ENCOUNTER — Other Ambulatory Visit: Payer: Self-pay | Admitting: Family Medicine

## 2023-07-16 NOTE — Telephone Encounter (Signed)
We can go up one more time to 2 mg if she wants to  Let me know what she thinks  Please schedule follow up with me after 12/9  Thanks

## 2023-07-16 NOTE — Telephone Encounter (Signed)
Per last refill you wanted patient to call and give update. Did not see one but I have reached out to patient she denies any side effects or symptoms on the new dose.

## 2023-07-17 NOTE — Telephone Encounter (Signed)
Patient okay with going up to 2mg , Appointment follow-up scheduled for 12.11.24 at 8am

## 2023-07-17 NOTE — Telephone Encounter (Signed)
Left message to return call to our office.  

## 2023-07-25 ENCOUNTER — Ambulatory Visit: Payer: Medicare HMO | Admitting: Family Medicine

## 2023-07-25 ENCOUNTER — Encounter: Payer: Self-pay | Admitting: Family Medicine

## 2023-07-25 VITALS — BP 124/78 | HR 68 | Temp 97.6°F | Ht 65.5 in | Wt 195.4 lb

## 2023-07-25 DIAGNOSIS — E1169 Type 2 diabetes mellitus with other specified complication: Secondary | ICD-10-CM | POA: Diagnosis not present

## 2023-07-25 DIAGNOSIS — F418 Other specified anxiety disorders: Secondary | ICD-10-CM

## 2023-07-25 DIAGNOSIS — E785 Hyperlipidemia, unspecified: Secondary | ICD-10-CM

## 2023-07-25 DIAGNOSIS — Z6832 Body mass index (BMI) 32.0-32.9, adult: Secondary | ICD-10-CM | POA: Diagnosis not present

## 2023-07-25 DIAGNOSIS — F17200 Nicotine dependence, unspecified, uncomplicated: Secondary | ICD-10-CM | POA: Diagnosis not present

## 2023-07-25 DIAGNOSIS — E6609 Other obesity due to excess calories: Secondary | ICD-10-CM | POA: Diagnosis not present

## 2023-07-25 DIAGNOSIS — E119 Type 2 diabetes mellitus without complications: Secondary | ICD-10-CM | POA: Diagnosis not present

## 2023-07-25 DIAGNOSIS — E66811 Obesity, class 1: Secondary | ICD-10-CM | POA: Diagnosis not present

## 2023-07-25 DIAGNOSIS — Z7985 Long-term (current) use of injectable non-insulin antidiabetic drugs: Secondary | ICD-10-CM

## 2023-07-25 DIAGNOSIS — I10 Essential (primary) hypertension: Secondary | ICD-10-CM | POA: Diagnosis not present

## 2023-07-25 LAB — POCT GLYCOSYLATED HEMOGLOBIN (HGB A1C): Hemoglobin A1C: 6 % — AB (ref 4.0–5.6)

## 2023-07-25 MED ORDER — HYDROCHLOROTHIAZIDE 25 MG PO TABS: 25.0000 mg | ORAL_TABLET | Freq: Every day | ORAL | 3 refills | Status: DC

## 2023-07-25 NOTE — Assessment & Plan Note (Signed)
Lab Results  Component Value Date   HGBA1C 6.0 (A) 07/25/2023   Improved Doing well on semaglutide 1 mg - will be going up to 2 mg in 2-3 weeks  Feeling ok / weight loss noted and pt is pleased No symptoms of hypoglycemia  Also continues metformin xr 1000 mg daily  Microalb utd On arb and statin  Foot exam is normal  Eye exam report from last mo sent for  Encouraged her to keep up the good job with low glycemic diet and more protein  Encouraged to add strength building exercise to prevent muscle loss

## 2023-07-25 NOTE — Assessment & Plan Note (Signed)
Bmi is down to 32.5 with semaglutide and better eating Discussed how this problem influences overall health and the risks it imposes  Reviewed plan for weight loss with lower calorie diet (via better food choices (lower glycemic and portion control) along with exercise building up to or more than 30 minutes 5 days per week including some aerobic activity and strength training

## 2023-07-25 NOTE — Progress Notes (Signed)
Subjective:    Patient ID: Kathleen Hernandez, female    DOB: [birth year], 67 y.o.   MRN: [id number]  HPI  Wt Readings from Last 3 Encounters:  07/25/23 195 lb 6 oz (88.6 kg)  06/25/23 201 lb 12.8 oz (91.5 kg)  04/23/23 211 lb 2 oz (95.8 kg)   32.02 kg/m  Vitals:   07/25/23 0755  BP: 124/78  Pulse: 68  Temp: 97.6 F (36.4 C)  SpO2: 96%    Pt presents for follow up of DM2 and chronic medical problems     HTN bp is stable today  No cp or palpitations or headaches or edema  No side effects to medicines  BP Readings from Last 3 Encounters:  07/25/23 124/78  06/25/23 102/72  04/23/23 118/68    Hydrochlorothiazide 25 mg daily  Losartan 100 mg daily   Lab Results  Component Value Date   NA 139 04/17/2023   K 4.4 04/17/2023   CO2 31 04/17/2023   GLUCOSE 108 (H) 04/17/2023   BUN 16 04/17/2023   CREATININE 0.73 04/17/2023   CALCIUM 9.2 04/17/2023   GFR 85.35 04/17/2023   GFRNONAA >60 02/02/2021      DM2 Lab Results  Component Value Date   HGBA1C 6.0 (A) 07/25/2023   Today 6.0   Metformin xr 1000 mg daily  We started semaglutide and is up to 2 mg now (still finishing the 1 mg)    No low blood glucose levels   Eating less with the new medicines Avoiding the sweets More protein  No snacking  Cravings are gone   On arb and statin    Eye exam 06/2023 at my eye doctor   Due for foot exam   Her incontinence   Smoking status : still smoking  No inclination to stop  No breathing problems  Declines lung cancer screening   Noticed a head tremor when she was getting her eye exam Has had for years      Hyperlipidemia Lab Results  Component Value Date   CHOL 141 04/17/2023   HDL 40.70 04/17/2023   LDLCALC 72 04/17/2023   TRIG 140.0 04/17/2023   CHOLHDL 3 04/17/2023   Atorvastatin 20 mg daily   Depression /anx    07/25/2023    8:09 AM 04/23/2023    8:53 AM 11/24/2022    2:05 PM 05/26/2022    8:25 AM 04/19/2022    8:20 AM  Depression  screen PHQ 2/9  Decreased Interest 0 0 3 0 0  Down, Depressed, Hopeless 0 0 0 0 0  PHQ - 2 Score 0 0 3 0 0  Altered sleeping 0 0 3    Tired, decreased energy 0 1 3    Change in appetite 0 0 1    Feeling bad or failure about yourself  0 0 0    Trouble concentrating 0 0 0    Moving slowly or fidgety/restless 0 0 0    Suicidal thoughts 0 0 0    PHQ-9 Score 0 1 10    Difficult doing work/chores Not difficult at all Not difficult at all Somewhat difficult     Overall doing better  Continues paxil 40 mg daily    Patient Active Problem List   Diagnosis Date Noted   Polyp of descending colon 06/25/2023   Polyp of sigmoid colon 06/25/2023   Colon cancer screening 04/23/2023   Routine general medical examination at a health care facility 04/19/2022   Estrogen deficiency 04/19/2022  Low back pain 03/01/2021   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   Leukocytosis 08/09/2020   PVC (premature ventricular contraction) 07/21/2020   Knee pain, bilateral 02/10/2020   Mixed incontinence urge and stress 02/10/2020   Encounter for general adult medical examination with abnormal findings 08/05/2019   Diabetes mellitus treated with injections of non-insulin medication (HCC) 08/23/2018   Hyperlipidemia associated with type 2 diabetes mellitus (HCC) 08/31/2017   Smoker 08/31/2017   Hypertension 08/31/2017   Encounter for screening mammogram for breast cancer 08/31/2017   Class 1 obesity due to excess calories with body mass index (BMI) of 32.0 to 32.9 in adult 08/31/2017   Depression with anxiety 08/31/2017   Insomnia 08/31/2017   Past Medical History:  Diagnosis Date   Depression    Diabetes mellitus without complication (HCC)    Family history of heart murmur    History of chickenpox    History of colon polyps    History of depression    History of gastroesophageal reflux (GERD)    History of hyperlipidemia    History of urinary incontinence    Hyperlipidemia     Hypertension    Past Surgical History:  Procedure Laterality Date   BREAST BIOPSY Right 2000   neg   BREAST REDUCTION SURGERY  2010   COLONOSCOPY WITH PROPOFOL N/A 06/25/2023   Procedure: COLONOSCOPY WITH PROPOFOL;  Surgeon: Toney Reil, MD;  Location: [facility] ENDOSCOPY;  Service: Gastroenterology;  Laterality: N/A;   GALLBLADDER SURGERY  2009   INCONTINENCE SURGERY  2009   PARTIAL HYSTERECTOMY  2009   POLYPECTOMY  06/25/2023   Procedure: POLYPECTOMY;  Surgeon: Toney Reil, MD;  Location: [facility] ENDOSCOPY;  Service: Gastroenterology;;   REDUCTION MAMMAPLASTY Bilateral 2000   Social History   Tobacco Use   Smoking status: Every Day    Current packs/day: 1.00    Average packs/day: 1 pack/day for 41.0 years (41.0 ttl pk-yrs)    Types: Cigarettes   Smokeless tobacco: Never  Vaping Use   Vaping status: Never Used  Substance Use Topics   Alcohol use: No   Drug use: No   Family History  Problem Relation Age of Onset   Arthritis Mother    Heart attack Mother    Early death Mother    Alcohol abuse Father    Alzheimer's disease Father    Non-Hodgkin's lymphoma Sister    Depression Sister    Diabetes Maternal Grandmother    Alcohol abuse Maternal Grandfather    Alzheimer's disease Paternal Grandmother    Alcohol abuse Brother    COPD Brother    Heart attack Brother    Hyperlipidemia Brother    Drug abuse Son    Breast cancer Neg Hx    Allergies  Allergen Reactions   Requip [Ropinirole Hcl] Other (See Comments)    Tongue swelling   Chantix [Varenicline] Rash   Current Outpatient Medications on File Prior to Visit  Medication Sig Dispense Refill   ACCU-CHEK FASTCLIX LANCETS MISC Check sugar once daily and as needed for diabetes. DX: E11.9 90 each 3   aspirin 325 MG tablet Take 325 mg by mouth daily.     atorvastatin (LIPITOR) 20 MG tablet Take 1 tablet (20 mg total) by mouth daily. 90 tablet 3   Cholecalciferol (VITAMIN D3) 25 MCG (1000 UT) CAPS Take 1  capsule by mouth daily.     EPINEPHrine (EPIPEN 2-PAK) 0.3 mg/0.3 mL IJ SOAJ injection Inject 0.3 mg into the muscle as needed for  anaphylaxis. 1 each 0   glucose blood (ACCU-CHEK GUIDE) test strip USE TO CHECK BLOOD SUGAR ONCE DAILY AND AS NEEDED FOR DM (DX. E11.9) 100 strip 1   losartan (COZAAR) 100 MG tablet Take 1 tablet (100 mg total) by mouth daily. 90 tablet 3   metFORMIN (GLUCOPHAGE-XR) 500 MG 24 hr tablet Take 2 tablets (1,000 mg total) by mouth daily with breakfast. 180 tablet 3   Omega-3 Fatty Acids (FISH OIL) 1200 MG CAPS Take 1 capsule by mouth daily.     PARoxetine (PAXIL) 40 MG tablet Take 1 tablet (40 mg total) by mouth daily. 90 tablet 3   Semaglutide, 2 MG/DOSE, 8 MG/3ML SOPN Inject 2 mg as directed once a week. 3 mL 0   vitamin E 400 UNIT capsule Take 400 Units by mouth daily.     zolpidem (AMBIEN) 10 MG tablet TAKE ONE TABLET BY MOUTH EVERY NIGHT AT BEDTIME AS NEEDED FOR SLEEP 30 tablet 3   No current facility-administered medications on file prior to visit.    Review of Systems  Constitutional:  Negative for activity change, appetite change, fatigue, fever and unexpected weight change.  HENT:  Negative for congestion, ear pain, rhinorrhea, sinus pressure and sore throat.   Eyes:  Negative for pain, redness and visual disturbance.  Respiratory:  Negative for cough, shortness of breath and wheezing.   Cardiovascular:  Negative for chest pain and palpitations.  Gastrointestinal:  Negative for abdominal pain, blood in stool, constipation and diarrhea.  Endocrine: Negative for polydipsia and polyuria.  Genitourinary:  Negative for dysuria, frequency and urgency.  Musculoskeletal:  Negative for arthralgias, back pain and myalgias.  Skin:  Negative for pallor and rash.  Allergic/Immunologic: Negative for environmental allergies.  Neurological:  Negative for dizziness, syncope and headaches.  Hematological:  Negative for adenopathy. Does not bruise/bleed easily.   Psychiatric/Behavioral:  Negative for decreased concentration and dysphoric mood. The patient is not nervous/anxious.        Objective:   Physical Exam Constitutional:      General: She is not in acute distress.    Appearance: Normal appearance. She is well-developed. She is obese. She is not ill-appearing or diaphoretic.  HENT:     Head: Normocephalic and atraumatic.  Eyes:     Conjunctiva/sclera: Conjunctivae normal.     Pupils: Pupils are equal, round, and reactive to light.  Neck:     Thyroid: No thyromegaly.     Vascular: No carotid bruit or JVD.  Cardiovascular:     Rate and Rhythm: Normal rate and regular rhythm.     Heart sounds: Normal heart sounds.     No gallop.  Pulmonary:     Effort: Pulmonary effort is normal. No respiratory distress.     Breath sounds: Normal breath sounds. No stridor. No wheezing, rhonchi or rales.     Comments: Diffusely distant bs  Abdominal:     General: There is no distension or abdominal bruit.     Palpations: Abdomen is soft.  Musculoskeletal:     Cervical back: Normal range of motion and neck supple.     Right lower leg: No edema.     Left lower leg: No edema.  Lymphadenopathy:     Cervical: No cervical adenopathy.  Skin:    General: Skin is warm and dry.     Coloration: Skin is not pale.     Findings: No rash.  Neurological:     Mental Status: She is alert.     Coordination:  Coordination normal.     Deep Tendon Reflexes: Reflexes are normal and symmetric. Reflexes normal.  Psychiatric:        Mood and Affect: Mood normal.           Assessment & Plan:   Problem List Items Addressed This Visit       Cardiovascular and Mediastinum   Hypertension    bp in fair control at this time  BP Readings from Last 1 Encounters:  07/25/23 124/78   No changes needed Most recent labs reviewed  Disc lifstyle change with low sodium diet and exercise  Plan to continue hctz 25 mg daily  Losartan 100 mg daily        Relevant  Medications   hydrochlorothiazide (HYDRODIURIL) 25 MG tablet     Endocrine   Hyperlipidemia associated with type 2 diabetes mellitus (HCC)    Disc goals for lipids and reasons to control them Rev last labs with pt Rev low sat fat diet in detail LDL of 72, controlled   Plan to continue atorvastatin 20 mg daily   Follow up 3 mo      Relevant Medications   hydrochlorothiazide (HYDRODIURIL) 25 MG tablet   Diabetes mellitus treated with injections of non-insulin medication (HCC) - Primary    Lab Results  Component Value Date   HGBA1C 6.0 (A) 07/25/2023   Improved Doing well on semaglutide 1 mg - will be going up to 2 mg in 2-3 weeks  Feeling ok / weight loss noted and pt is pleased No symptoms of hypoglycemia  Also continues metformin xr 1000 mg daily  Microalb utd On arb and statin  Foot exam is normal  Eye exam report from last mo sent for  Encouraged her to keep up the good job with low glycemic diet and more protein  Encouraged to add strength building exercise to prevent muscle loss         Other   Smoker    Disc in detail risks of smoking and possible outcomes including copd, vascular/ heart disease, cancer , respiratory and sinus infections  Pt voices understanding Not interested in quitting Also declines the [place] lung cancer screening program       Depression with anxiety    Phq of 0 Doing well  Weight loss has helped mood Continues paxil 40 mg daily        Class 1 obesity due to excess calories with body mass index (BMI) of 32.0 to 32.9 in adult    Bmi is down to 32.5 with semaglutide and better eating Discussed how this problem influences overall health and the risks it imposes  Reviewed plan for weight loss with lower calorie diet (via better food choices (lower glycemic and portion control) along with exercise building up to or more than 30 minutes 5 days per week including some aerobic activity and strength training

## 2023-07-25 NOTE — Patient Instructions (Addendum)
Keep eating protein   Join the gym as planned  Stay active  Add some strength training to your routine, this is important for bone and brain health and can reduce your risk of falls and help your body use insulin properly and regulate weight  Light weights, exercise bands , and internet videos are a good way to start  Yoga (chair or regular), machines , floor exercises or a gym with machines are also good options   Go up to the 2 mg semaglutide when it is time  Let us know if any problems   Keep up the good work  Take care of yourself   Please keep thinking about quitting smoking and also the lung cancer screening program   Try to get most of your carbohydrates from produce (with the exception of white potatoes) and whole grains Eat less bread/pasta/rice/snack foods/cereals/sweets and other items from the middle of the grocery store (processed carbs) Keep up the protein

## 2023-07-25 NOTE — Assessment & Plan Note (Signed)
Phq of 0 Doing well  Weight loss has helped mood Continues paxil 40 mg daily

## 2023-07-25 NOTE — Assessment & Plan Note (Signed)
Disc goals for lipids and reasons to control them Rev last labs with pt Rev low sat fat diet in detail LDL of 72, controlled   Plan to continue atorvastatin 20 mg daily   Follow up 3 mo

## 2023-07-25 NOTE — Assessment & Plan Note (Signed)
Disc in detail risks of smoking and possible outcomes including copd, vascular/ heart disease, cancer , respiratory and sinus infections  Pt voices understanding Not interested in quitting Also declines the CT lung cancer screening program

## 2023-07-25 NOTE — Assessment & Plan Note (Signed)
bp in fair control at this time  BP Readings from Last 1 Encounters:  07/25/23 124/78   No changes needed Most recent labs reviewed  Disc lifstyle change with low sodium diet and exercise  Plan to continue hctz 25 mg daily  Losartan 100 mg daily

## 2023-08-15 ENCOUNTER — Other Ambulatory Visit: Payer: Self-pay | Admitting: Family Medicine

## 2023-08-16 NOTE — Telephone Encounter (Signed)
 Last filled on 07/17/23 # 3 mL/ 0 refills  F/u scheduled 10/23/23

## 2023-08-24 ENCOUNTER — Telehealth: Payer: Self-pay

## 2023-08-24 ENCOUNTER — Other Ambulatory Visit (HOSPITAL_COMMUNITY): Payer: Self-pay

## 2023-08-24 NOTE — Telephone Encounter (Signed)
*  Primary  Pharmacy Patient Advocate Encounter   Received notification from CoverMyMeds that prior authorization for Ozempic  (2 MG/DOSE) 8MG /3ML pen-injectors  is required/requested.   Insurance verification completed.   The patient is insured through [facility] .   Per test claim: PA required; PA submitted to above mentioned insurance via CoverMyMeds Key/confirmation #/EOC [id number] Status is pending

## 2023-08-28 ENCOUNTER — Other Ambulatory Visit (HOSPITAL_COMMUNITY): Payer: Self-pay

## 2023-08-28 NOTE — Telephone Encounter (Signed)
 Pharmacy Patient Advocate Encounter  Received notification from [employer] that Prior Authorization for Ozempic  8mg /68ml has been APPROVED from 08/24/2023 to 08/13/2024. Ran test claim, Copay is $302.00. This test claim was processed through [facility]- copay amounts may vary at other pharmacies due to pharmacy/plan contracts, or as the patient moves through the different stages of their insurance plan.

## 2023-09-18 ENCOUNTER — Ambulatory Visit (INDEPENDENT_AMBULATORY_CARE_PROVIDER_SITE_OTHER): Payer: Medicare Other

## 2023-09-18 VITALS — BP 118/70 | Ht 65.5 in | Wt 183.6 lb

## 2023-09-18 DIAGNOSIS — Z0000 Encounter for general adult medical examination without abnormal findings: Secondary | ICD-10-CM

## 2023-09-18 NOTE — Progress Notes (Signed)
 Subjective:   Kathleen Hernandez is a 68 y.o. female who presents for Medicare Annual (Subsequent) preventive examination.  Visit Complete: In person  Patient Medicare AWV questionnaire was completed by the patient on (not done); I have confirmed that all information answered by patient is correct and no changes since this date.  Cardiac Risk Factors include: advanced age (>41men, >59 women);diabetes mellitus;dyslipidemia;hypertension;obesity (BMI >30kg/m2);sedentary lifestyle;smoking/ tobacco exposure     Objective:    Today's Vitals   09/18/23 1545  Weight: 183 lb 9.6 oz (83.3 kg)  Height: 5' 5.5 (1.664 m)   Body mass index is 30.09 kg/m.     09/18/2023    3:59 PM 06/25/2023    7:10 AM 05/26/2022    8:29 AM 09/11/2018    9:09 AM 09/16/2017   12:48 PM  Advanced Directives  Does Patient Have a Medical Advance Directive? No No No No No  Would patient like information on creating a medical advance directive?  No - Patient declined No - Patient declined No - Patient declined No - Patient declined    Current Medications (verified) Outpatient Encounter Medications as of 09/18/2023  Medication Sig   ACCU-CHEK FASTCLIX LANCETS MISC Check sugar once daily and as needed for diabetes. DX: E11.9   aspirin 325 MG tablet Take 325 mg by mouth daily.   atorvastatin  (LIPITOR) 20 MG tablet Take 1 tablet (20 mg total) by mouth daily.   Cholecalciferol (VITAMIN D3) 25 MCG (1000 UT) CAPS Take 1 capsule by mouth daily.   EPINEPHrine  (EPIPEN  2-PAK) 0.3 mg/0.3 mL IJ SOAJ injection Inject 0.3 mg into the muscle as needed for anaphylaxis.   glucose blood (ACCU-CHEK GUIDE) test strip USE TO CHECK BLOOD SUGAR ONCE DAILY AND AS NEEDED FOR DM (DX. E11.9)   hydrochlorothiazide  (HYDRODIURIL ) 25 MG tablet Take 1 tablet (25 mg total) by mouth daily.   losartan  (COZAAR ) 100 MG tablet Take 1 tablet (100 mg total) by mouth daily.   metFORMIN  (GLUCOPHAGE -XR) 500 MG 24 hr tablet Take 2 tablets (1,000 mg total) by  mouth daily with breakfast.   PARoxetine  (PAXIL ) 40 MG tablet Take 1 tablet (40 mg total) by mouth daily.   Semaglutide , 2 MG/DOSE, (OZEMPIC , 2 MG/DOSE,) 8 MG/3ML SOPN INJECT 2 MG AS DIRECTED ONCE A WEEK.   vitamin E 400 UNIT capsule Take 400 Units by mouth daily.   zolpidem  (AMBIEN ) 10 MG tablet TAKE ONE TABLET BY MOUTH EVERY NIGHT AT BEDTIME AS NEEDED FOR SLEEP   Omega-3 Fatty Acids (FISH OIL) 1200 MG CAPS Take 1 capsule by mouth daily. (Patient not taking: Reported on 09/18/2023)   No facility-administered encounter medications on file as of 09/18/2023.    Allergies (verified) Requip [ropinirole hcl] and Chantix [varenicline]   History: Past Medical History:  Diagnosis Date   Depression    Diabetes mellitus without complication (HCC)    Family history of heart murmur    History of chickenpox    History of colon polyps    History of depression    History of gastroesophageal reflux (GERD)    History of hyperlipidemia    History of urinary incontinence    Hyperlipidemia    Hypertension    Past Surgical History:  Procedure Laterality Date   BREAST BIOPSY Right 2000   neg   BREAST REDUCTION SURGERY  2010   COLONOSCOPY WITH PROPOFOL  N/A 06/25/2023   Procedure: COLONOSCOPY WITH PROPOFOL ;  Surgeon: Unk Corinn Skiff, MD;  Location: [facility] ENDOSCOPY;  Service: Gastroenterology;  Laterality: N/A;  GALLBLADDER SURGERY  2009   INCONTINENCE SURGERY  2009   PARTIAL HYSTERECTOMY  2009   POLYPECTOMY  06/25/2023   Procedure: POLYPECTOMY;  Surgeon: Unk Corinn Skiff, MD;  Location: [facility] ENDOSCOPY;  Service: Gastroenterology;;   REDUCTION MAMMAPLASTY Bilateral 2000   Family History  Problem Relation Age of Onset   Arthritis Mother    Heart attack Mother    Early death Mother    Alcohol abuse Father    Alzheimer's disease Father    Non-Hodgkin's lymphoma Sister    Depression Sister    Diabetes Maternal Grandmother    Alcohol abuse Maternal Grandfather    Alzheimer's disease  Paternal Grandmother    Alcohol abuse Brother    COPD Brother    Heart attack Brother    Hyperlipidemia Brother    Drug abuse Son    Breast cancer Neg Hx    Social History   Socioeconomic History   Marital status: Widowed    Spouse name: Not on file   Number of children: Not on file   Years of education: Not on file   Highest education level: Not on file  Occupational History   Not on file  Tobacco Use   Smoking status: Every Day    Current packs/day: 1.00    Average packs/day: 1 pack/day for 41.0 years (41.0 ttl pk-yrs)    Types: Cigarettes   Smokeless tobacco: Never  Vaping Use   Vaping status: Never Used  Substance and Sexual Activity   Alcohol use: No   Drug use: No   Sexual activity: Not on file  Other Topics Concern   Not on file  Social History Narrative   Lives at home with two grandchildren   Social Drivers of Health   Financial Resource Strain: Low Risk  (09/18/2023)   Overall Financial Resource Strain (CARDIA)    Difficulty of Paying Living Expenses: Not hard at all  Food Insecurity: No Food Insecurity (09/18/2023)   Hunger Vital Sign    Worried About Running Out of Food in the Last Year: Never true    Ran Out of Food in the Last Year: Never true  Transportation Needs: No Transportation Needs (09/18/2023)   PRAPARE - Administrator, Civil Service (Medical): No    Lack of Transportation (Non-Medical): No  Physical Activity: Inactive (09/18/2023)   Exercise Vital Sign    Days of Exercise per Week: 0 days    Minutes of Exercise per Session: 0 min  Stress: No Stress Concern Present (09/18/2023)   [employer] of Occupational Health - Occupational Stress Questionnaire    Feeling of Stress : Only a little  Social Connections: Socially Isolated (09/18/2023)   Social Connection and Isolation Panel [NHANES]    Frequency of Communication with Friends and Family: More than three times a week    Frequency of Social Gatherings with Friends and Family: More  than three times a week    Attends Religious Services: Never    Database Administrator or Organizations: No    Attends Banker Meetings: Never    Marital Status: Widowed    Tobacco Counseling Ready to quit: Not Answered Counseling given: Not Answered   Clinical Intake:  Pre-visit preparation completed: Yes  Pain : No/denies pain    BMI - recorded: 30.09 Nutritional Status: BMI > 30  Obese Nutritional Risks: None Diabetes: Yes CBG done?: No Did pt. bring in CBG monitor from home?: No  How often do you need to have  someone help you when you read instructions, pamphlets, or other written materials from your doctor or pharmacy?: 1 - Never  Interpreter Needed?: No  Comments: 2 grandsons live with pt Information entered by :: B.Sonnia Strong,LPN   Activities of Daily Living    09/18/2023    3:59 PM  In your present state of health, do you have any difficulty performing the following activities:  Hearing? 0  Vision? 0  Difficulty concentrating or making decisions? 0  Walking or climbing stairs? 0  Dressing or bathing? 0  Doing errands, shopping? 0  Preparing Food and eating ? N  Using the Toilet? N  In the past six months, have you accidently leaked urine? N  Do you have problems with loss of bowel control? N  Managing your Medications? N  Managing your Finances? N  Housekeeping or managing your Housekeeping? N    Patient Care Team: Tower, Laine LABOR, MD as PCP - General (Family Medicine) [facility]  Indicate any recent Medical Services you may have received from other than [facility] providers in the past year (date may be approximate).     Assessment:   This is a routine wellness examination for Cameryn.  Hearing/Vision screen Hearing Screening - Comments:: Pt says hearing is good Vision Screening - Comments:: Pt says her vision is good  [facility]   Goals Addressed               This Visit's Progress     No current  goals (pt-stated)        I would like to join the [facility]        Depression Screen    09/18/2023    3:52 PM 07/25/2023    8:09 AM 04/23/2023    8:53 AM 11/24/2022    2:05 PM 05/26/2022    8:25 AM 04/19/2022    8:20 AM 11/24/2020   10:33 AM  PHQ 2/9 Scores  PHQ - 2 Score 0 0 0 3 0 0 2  PHQ- 9 Score  0 1 10   5     Fall Risk    09/18/2023    4:22 PM 07/25/2023    8:09 AM 04/23/2023    8:53 AM 11/24/2022    2:05 PM 05/26/2022    8:29 AM  Fall Risk   Falls in the past year? 0 0 0 0 0  Number falls in past yr: 0 0 0 0 0  Injury with Fall? 0 0 0 0 0  Risk for fall due to : No Fall Risks No Fall Risks No Fall Risks No Fall Risks No Fall Risks  Follow up Education provided;Falls prevention discussed Falls evaluation completed Falls evaluation completed Falls evaluation completed Falls prevention discussed    MEDICARE RISK AT HOME: Medicare Risk at Home Any stairs in or around the home?: Yes If so, are there any without handrails?: Yes Home free of loose throw rugs in walkways, pet beds, electrical cords, etc?: Yes Adequate lighting in your home to reduce risk of falls?: Yes Life alert?: No Use of a cane, walker or w/c?: No Grab bars in the bathroom?: Yes Shower chair or bench in shower?: Yes Elevated toilet seat or a handicapped toilet?: Yes  TIMED UP AND GO:  Was the test performed?  Yes  Length of time to ambulate 10 feet: 10 sec Gait steady and fast without use of assistive device    Cognitive Function:        09/18/2023  4:01 PM 05/26/2022    8:29 AM  6CIT Screen  What Year? 0 points 0 points  What month? 0 points 0 points  What time? 0 points 0 points  Count back from 20 0 points 0 points  Months in reverse 0 points 0 points  Repeat phrase 0 points 0 points  Total Score 0 points 0 points    Immunizations Immunization History  Administered Date(s) Administered   Fluad Quad(high Dose 65+) 04/29/2019   Influenza,inj,Quad PF,6+ Mos 08/23/2018    Influenza-Unspecified 05/28/2017   Pneumococcal Polysaccharide-23 10/08/2017   Tdap 10/08/2017    TDAP status: Up to date  Flu Vaccine status: Declined, Education has been provided regarding the importance of this vaccine but patient still declined. Advised may receive this vaccine at local pharmacy or Health Dept. Aware to provide a copy of the vaccination record if obtained from local pharmacy or Health Dept. Verbalized acceptance and understanding.  Pneumococcal vaccine status: Up to date  Covid-19 vaccine status: Declined, Education has been provided regarding the importance of this vaccine but patient still declined. Advised may receive this vaccine at local pharmacy or Health Dept.or vaccine clinic. Aware to provide a copy of the vaccination record if obtained from local pharmacy or Health Dept. Verbalized acceptance and understanding.  Qualifies for Shingles Vaccine? Yes   Zostavax completed Yes   Shingrix Completed?: Yes  Screening Tests Health Maintenance  Topic Date Due   OPHTHALMOLOGY EXAM  07/05/2023   INFLUENZA VACCINE  11/12/2023 (Originally 03/15/2023)   Lung Cancer Screening  04/22/2024 (Originally 04/14/2006)   Pneumonia Vaccine 12+ Years old (2 of 2 - PCV) 04/22/2024 (Originally 10/08/2018)   Zoster Vaccines- Shingrix (1 of 2) 10/22/2024 (Originally 04/14/2006)   COVID-19 Vaccine (1 - 2024-25 season) 08/09/2025 (Originally 04/15/2023)   Hepatitis C Screening  10/09/2027 (Originally 04/14/1974)   HEMOGLOBIN A1C  01/23/2024   Diabetic kidney evaluation - eGFR measurement  04/16/2024   Diabetic kidney evaluation - Urine ACR  04/16/2024   MAMMOGRAM  05/31/2024   FOOT EXAM  07/24/2024   Medicare Annual Wellness (AWV)  09/17/2024   Colonoscopy  06/24/2026   DTaP/Tdap/Td (2 - Td or Tdap) 10/09/2027   DEXA SCAN  Completed   HPV VACCINES  Aged Out    Health Maintenance  Health Maintenance Due  Topic Date Due   OPHTHALMOLOGY EXAM  07/05/2023    Colorectal cancer screening:  Type of screening: Colonoscopy. Completed 06/25/2023. Repeat every 3 years  Mammogram status: Completed 06/01/2023. Repeat every year  Bone Density status: Completed 05/30/2022. Results reflect: Bone density results: NORMAL. Repeat every 5 years.  Lung Cancer Screening: (Low Dose CT Chest recommended if Age 7-80 years, 20 pack-year currently smoking OR have quit w/in 15years.) does qualify.   Lung Cancer Screening Referral: no pt declines  Additional Screening:  Hepatitis C Screening: does not qualify; Completed no  Vision Screening: Recommended annual ophthalmology exams for early detection of glaucoma and other disorders of the eye. Is the patient up to date with their annual eye exam?  Yes  Who is the provider or what is the name of the office in which the patient attends annual eye exams? [facility] If pt is not established with a provider, would they like to be referred to a provider to establish care? No .   Dental Screening: Recommended annual dental exams for proper oral hygiene  Diabetic Foot Exam: Diabetic Foot Exam: Completed 07/25/23  Community Resource Referral / Chronic Care Management: CRR required this visit?  No   CCM required this visit?  No     Plan:     I have personally reviewed and noted the following in the patient's chart:   Medical and social history Use of alcohol, tobacco or illicit drugs  Current medications and supplements including opioid prescriptions. Patient is not currently taking opioid prescriptions. Functional ability and status Nutritional status Physical activity Advanced directives List of other physicians Hospitalizations, surgeries, and ER visits in previous 12 months Vitals Screenings to include cognitive, depression, and falls Referrals and appointments  In addition, I have reviewed and discussed with patient certain preventive protocols, quality metrics, and best practice recommendations. A written personalized care plan for  preventive services as well as general preventive health recommendations were provided to patient.     Erminio LITTIE Saris, LPN   02/15/7973   After Visit Summary: (MyChart) Due to this being a telephonic visit, the after visit summary with patients personalized plan was offered to patient via MyChart   Nurse Notes: The patient states they are doing well and has no concerns or questions at this time.  Lung Cancer Screening: Patient declined lung cancer screening

## 2023-09-18 NOTE — Patient Instructions (Signed)
 Ms. Ogando , Thank you for taking time to come for your Medicare Wellness Visit. I appreciate your ongoing commitment to your health goals. Please review the following plan we discussed and let me know if I can assist you in the future.   Referrals/Orders/Follow-Ups/Clinician Recommendations: none  This is a list of the screening recommended for you and due dates:  Health Maintenance  Topic Date Due   Eye exam for diabetics  07/05/2023   Flu Shot  11/12/2023*   Screening for Lung Cancer  04/22/2024*   Pneumonia Vaccine (2 of 2 - PCV) 04/22/2024*   Zoster (Shingles) Vaccine (1 of 2) 10/22/2024*   COVID-19 Vaccine (1 - 2024-25 season) 08/09/2025*   Hepatitis C Screening  10/09/2027*   Hemoglobin A1C  01/23/2024   Yearly kidney function blood test for diabetes  04/16/2024   Yearly kidney health urinalysis for diabetes  04/16/2024   Mammogram  05/31/2024   Complete foot exam   07/24/2024   Medicare Annual Wellness Visit  09/17/2024   Colon Cancer Screening  06/24/2026   DTaP/Tdap/Td vaccine (2 - Td or Tdap) 10/09/2027   DEXA scan (bone density measurement)  Completed   HPV Vaccine  Aged Out  *Topic was postponed. The date shown is not the original due date.    Advanced directives: (Declined) Advance directive discussed with you today. Even though you declined this today, please call our office should you change your mind, and we can give you the proper paperwork for you to fill out.  Next Medicare Annual Wellness Visit scheduled for next year: Yes 09/18/2024 @ 3:40pm in person

## 2023-10-03 ENCOUNTER — Other Ambulatory Visit: Payer: Self-pay | Admitting: Family Medicine

## 2023-10-04 NOTE — Telephone Encounter (Signed)
Name of Medication: Ambien Name of [facility]: [facility] Last Fill or Written Date and Quantity: 02/13/23 #30 tab/ 3 refills   Last Office Visit and Type: f/u 07/25/23 Next Office Visit and Type:  f/u 10/23/23

## 2023-10-23 ENCOUNTER — Encounter: Payer: Self-pay | Admitting: Family Medicine

## 2023-10-23 ENCOUNTER — Ambulatory Visit (INDEPENDENT_AMBULATORY_CARE_PROVIDER_SITE_OTHER): Payer: Medicare HMO | Admitting: Family Medicine

## 2023-10-23 VITALS — BP 110/68 | HR 53 | Temp 97.6°F | Ht 65.5 in | Wt 177.1 lb

## 2023-10-23 DIAGNOSIS — E1169 Type 2 diabetes mellitus with other specified complication: Secondary | ICD-10-CM

## 2023-10-23 DIAGNOSIS — E785 Hyperlipidemia, unspecified: Secondary | ICD-10-CM | POA: Diagnosis not present

## 2023-10-23 DIAGNOSIS — E663 Overweight: Secondary | ICD-10-CM

## 2023-10-23 DIAGNOSIS — F17200 Nicotine dependence, unspecified, uncomplicated: Secondary | ICD-10-CM | POA: Diagnosis not present

## 2023-10-23 DIAGNOSIS — Z7985 Long-term (current) use of injectable non-insulin antidiabetic drugs: Secondary | ICD-10-CM

## 2023-10-23 DIAGNOSIS — I10 Essential (primary) hypertension: Secondary | ICD-10-CM | POA: Diagnosis not present

## 2023-10-23 DIAGNOSIS — E119 Type 2 diabetes mellitus without complications: Secondary | ICD-10-CM

## 2023-10-23 LAB — POCT GLYCOSYLATED HEMOGLOBIN (HGB A1C): Hemoglobin A1C: 5.5 % (ref 4.0–5.6)

## 2023-10-23 MED ORDER — OZEMPIC (2 MG/DOSE) 8 MG/3ML ~~LOC~~ SOPN: PEN_INJECTOR | SUBCUTANEOUS | 6 refills | Status: AC

## 2023-10-23 MED ORDER — METFORMIN HCL ER 500 MG PO TB24: 1000.0000 mg | ORAL_TABLET | Freq: Every day | ORAL | 3 refills | Status: DC

## 2023-10-23 NOTE — Patient Instructions (Addendum)
 You need exercise at least 5 days per week  Walk/ stay active Add some strength training to your routine, this is important for bone and brain health and can reduce your risk of falls and help your body use insulin properly and regulate weight  Light weights, exercise bands , and internet videos are a good way to start  Yoga (chair or regular), machines , floor exercises or a gym with machines are also good options   Yard work counts  Heavy house work counts   Get protein with every meal  The following are examples of protein in diet  Meat  Fish  Eggs  Dairy products  Soy products  Oat milk  Almond milk Nuts and nut butters  Dried beans   Cut back metformin xr to 500 mg once daily    Keep thinking about quitting smoking

## 2023-10-23 NOTE — Progress Notes (Signed)
 Subjective:    Patient ID: Kathleen Hernandez, female    DOB: 27-Nov-1955, 68 y.o.   MRN: [id number]  HPI  Wt Readings from Last 3 Encounters:  10/23/23 177 lb 2 oz (80.3 kg)  09/18/23 183 lb 9.6 oz (83.3 kg)  07/25/23 195 lb 6 oz (88.6 kg)   29.03 kg/m  Has lost weight   Vitals:   10/23/23 0849  BP: 110/68  Pulse: (!) 53  Temp: 97.6 F (36.4 C)  SpO2: 95%    Pt presents for follow up of DM2 and chronic health problems  HTN bp is stable today  No cp or palpitations or headaches or edema  No side effects to medicines  BP Readings from Last 3 Encounters:  10/23/23 110/68  09/18/23 118/70  07/25/23 124/78    Losartan and hydrochlorothiazide   Lab Results  Component Value Date   NA 139 04/17/2023   K 4.4 04/17/2023   CO2 31 04/17/2023   GLUCOSE 108 (H) 04/17/2023   BUN 16 04/17/2023   CREATININE 0.73 04/17/2023   CALCIUM 9.2 04/17/2023   GFR 85.35 04/17/2023   GFRNONAA >60 02/02/2021    DM2 Lab Results  Component Value Date   HGBA1C 5.5 10/23/2023   HGBA1C 6.0 (A) 07/25/2023   HGBA1C 6.6 (H) 04/17/2023  Down to 5.5   Metformin xr 1000 mg daily  Semaglutide 2 mg weekly  Has lost weight  On arb and statin   No low glucose   Big change in appetite Eating healthier also   Lab Results  Component Value Date   MICROALBUR 2.4 (H) 04/17/2023   MICROALBUR 2.9 (H) 04/10/2022  Ratio was normal at 10   Eye exam - in past year    Hyperlipidemia Lab Results  Component Value Date   CHOL 141 04/17/2023   CHOL 143 04/10/2022   CHOL 142 06/09/2021   Lab Results  Component Value Date   HDL 40.70 04/17/2023   HDL 44.80 04/10/2022   HDL 43.20 06/09/2021   Lab Results  Component Value Date   LDLCALC 72 04/17/2023   LDLCALC 74 04/10/2022   LDLCALC 74 06/09/2021   Lab Results  Component Value Date   TRIG 140.0 04/17/2023   TRIG 122.0 04/10/2022   TRIG 123.0 06/09/2021   Lab Results  Component Value Date   CHOLHDL 3 04/17/2023   CHOLHDL 3  04/10/2022   CHOLHDL 3 06/09/2021   No results found for: "LDLDIRECT" Atorvastatin 20 mg daily   Smoking status  Smokes less in the winter/has to go outside  1 ppd Not ready to quit  Declines lung cancer screening   Exercise-not much  Ready to start yard work this week        Patient Active Problem List   Diagnosis Date Noted   Polyp of descending colon 06/25/2023   Polyp of sigmoid colon 06/25/2023   Colon cancer screening 04/23/2023   Routine general medical examination at a health care facility 04/19/2022   Estrogen deficiency 04/19/2022   Low back pain 03/01/2021   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   Leukocytosis 08/09/2020   PVC (premature ventricular contraction) 07/21/2020   Knee pain, bilateral 02/10/2020   Mixed incontinence urge and stress 02/10/2020   Encounter for general adult medical examination with abnormal findings 08/05/2019   Diabetes mellitus treated with injections of non-insulin medication (HCC) 08/23/2018   Hyperlipidemia associated with type 2 diabetes mellitus (HCC) 08/31/2017   Smoker 08/31/2017   Hypertension 08/31/2017  Encounter for screening mammogram for breast cancer 08/31/2017   Overweight (BMI 25.0-29.9) 08/31/2017   Depression with anxiety 08/31/2017   Insomnia 08/31/2017   Past Medical History:  Diagnosis Date   Depression    Diabetes mellitus without complication (HCC)    Family history of heart murmur    History of chickenpox    History of colon polyps    History of depression    History of gastroesophageal reflux (GERD)    History of hyperlipidemia    History of urinary incontinence    Hyperlipidemia    Hypertension    Past Surgical History:  Procedure Laterality Date   BREAST BIOPSY Right 2000   neg   BREAST REDUCTION SURGERY  2010   COLONOSCOPY WITH PROPOFOL N/A 06/25/2023   Procedure: COLONOSCOPY WITH PROPOFOL;  Surgeon: Toney Reil, MD;  Location: [facility];  Service:  Gastroenterology;  Laterality: N/A;   GALLBLADDER SURGERY  2009   INCONTINENCE SURGERY  2009   PARTIAL HYSTERECTOMY  2009   POLYPECTOMY  06/25/2023   Procedure: POLYPECTOMY;  Surgeon: Toney Reil, MD;  Location: [facility] ENDOSCOPY;  Service: Gastroenterology;;   REDUCTION MAMMAPLASTY Bilateral 2000   Social History   Tobacco Use   Smoking status: Every Day    Current packs/day: 1.00    Average packs/day: 1 pack/day for 41.0 years (41.0 ttl pk-yrs)    Types: Cigarettes   Smokeless tobacco: Never  Vaping Use   Vaping status: Never Used  Substance Use Topics   Alcohol use: No   Drug use: No   Family History  Problem Relation Age of Onset   Arthritis Mother    Heart attack Mother    Early death Mother    Alcohol abuse Father    Alzheimer's disease Father    Non-Hodgkin's lymphoma Sister    Depression Sister    Diabetes Maternal Grandmother    Alcohol abuse Maternal Grandfather    Alzheimer's disease Paternal Grandmother    Alcohol abuse Brother    COPD Brother    Heart attack Brother    Hyperlipidemia Brother    Drug abuse Son    Breast cancer Neg Hx    Allergies  Allergen Reactions   Requip [Ropinirole Hcl] Other (See Comments)    Tongue swelling   Chantix [Varenicline] Rash   Current Outpatient Medications on File Prior to Visit  Medication Sig Dispense Refill   ACCU-CHEK FASTCLIX LANCETS MISC Check sugar once daily and as needed for diabetes. DX: E11.9 90 each 3   aspirin 325 MG tablet Take 325 mg by mouth daily.     atorvastatin (LIPITOR) 20 MG tablet Take 1 tablet (20 mg total) by mouth daily. 90 tablet 3   Cholecalciferol (VITAMIN D3) 25 MCG (1000 UT) CAPS Take 1 capsule by mouth daily.     EPINEPHrine (EPIPEN 2-PAK) 0.3 mg/0.3 mL IJ SOAJ injection Inject 0.3 mg into the muscle as needed for anaphylaxis. 1 each 0   glucose blood (ACCU-CHEK GUIDE) test strip USE TO CHECK BLOOD SUGAR ONCE DAILY AND AS NEEDED FOR DM (DX. E11.9) 100 strip 1    hydrochlorothiazide (HYDRODIURIL) 25 MG tablet Take 1 tablet (25 mg total) by mouth daily. 90 tablet 3   losartan (COZAAR) 100 MG tablet Take 1 tablet (100 mg total) by mouth daily. 90 tablet 3   Omega-3 Fatty Acids (FISH OIL) 1200 MG CAPS Take 1 capsule by mouth daily.     PARoxetine (PAXIL) 40 MG tablet Take 1 tablet (40 mg  total) by mouth daily. 90 tablet 3   vitamin E 400 UNIT capsule Take 400 Units by mouth daily.     zolpidem (AMBIEN) 10 MG tablet TAKE 1 TABLET BY MOUTH EVERY NIGHT AT BEDTIME AS NEEDED FOR SLEEP 30 tablet 3   No current facility-administered medications on file prior to visit.    Review of Systems  Constitutional:  Negative for activity change, appetite change, fatigue, fever and unexpected weight change.  HENT:  Negative for congestion, ear pain, rhinorrhea, sinus pressure and sore throat.   Eyes:  Negative for pain, redness and visual disturbance.  Respiratory:  Negative for cough, shortness of breath and wheezing.   Cardiovascular:  Negative for chest pain and palpitations.  Gastrointestinal:  Negative for abdominal pain, blood in stool, constipation and diarrhea.  Endocrine: Negative for polydipsia and polyuria.  Genitourinary:  Negative for dysuria, frequency and urgency.  Musculoskeletal:  Negative for arthralgias, back pain and myalgias.  Skin:  Negative for pallor and rash.  Allergic/Immunologic: Negative for environmental allergies.  Neurological:  Negative for dizziness, syncope and headaches.  Hematological:  Negative for adenopathy. Does not bruise/bleed easily.  Psychiatric/Behavioral:  Negative for decreased concentration and dysphoric mood. The patient is not nervous/anxious.        Objective:   Physical Exam Constitutional:      General: She is not in acute distress.    Appearance: Normal appearance. She is well-developed. She is not ill-appearing or diaphoretic.     Comments: Weight loss noted overwt  HENT:     Head: Normocephalic and  atraumatic.  Eyes:     Conjunctiva/sclera: Conjunctivae normal.     Pupils: Pupils are equal, round, and reactive to light.  Neck:     Thyroid: No thyromegaly.     Vascular: No carotid bruit or JVD.  Cardiovascular:     Rate and Rhythm: Normal rate and regular rhythm.     Heart sounds: Normal heart sounds.     No gallop.  Pulmonary:     Effort: Pulmonary effort is normal. No respiratory distress.     Breath sounds: Normal breath sounds. No stridor. No wheezing, rhonchi or rales.     Comments: Diffusely distant bs  Abdominal:     General: There is no distension or abdominal bruit.     Palpations: Abdomen is soft.  Musculoskeletal:     Cervical back: Normal range of motion and neck supple.     Right lower leg: No edema.     Left lower leg: No edema.  Lymphadenopathy:     Cervical: No cervical adenopathy.  Skin:    General: Skin is warm and dry.     Coloration: Skin is not pale.     Findings: No rash.  Neurological:     Mental Status: She is alert.     Coordination: Coordination normal.     Deep Tendon Reflexes: Reflexes are normal and symmetric. Reflexes normal.  Psychiatric:        Mood and Affect: Mood normal.           Assessment & Plan:   Problem List Items Addressed This Visit       Cardiovascular and Mediastinum   Hypertension   bp in fair control at this time  BP Readings from Last 1 Encounters:  10/23/23 110/68   No changes needed Most recent labs reviewed  Disc lifstyle change with low sodium diet and exercise  Plan to continue hctz 25 mg daily  Losartan 100 mg daily  Endocrine   Hyperlipidemia associated with type 2 diabetes mellitus (HCC) - Primary   Disc goals for lipids and reasons to control them Rev last labs with pt Rev low sat fat diet in detail LDL of 72, controlled   Plan to continue atorvastatin 20 mg daily   Re check at annual exam      Relevant Medications   metFORMIN (GLUCOPHAGE-XR) 500 MG 24 hr tablet    Semaglutide, 2 MG/DOSE, (OZEMPIC, 2 MG/DOSE,) 8 MG/3ML SOPN   Diabetes mellitus treated with injections of non-insulin medication (HCC)   Lab Results  Component Value Date   HGBA1C 5.5 10/23/2023   HGBA1C 6.0 (A) 07/25/2023   HGBA1C 6.6 (H) 04/17/2023   Improved Will decrease metformin xr to 500 mg daily  Continue semaglutide 2 mg weekly /tolerating well with weight loss  No low glycose levels  Encouraged more strength building exercise and protein to avoid muscle loss  Sent for eye exam report  Microalb is utd and normal        Relevant Medications   metFORMIN (GLUCOPHAGE-XR) 500 MG 24 hr tablet   Semaglutide, 2 MG/DOSE, (OZEMPIC, 2 MG/DOSE,) 8 MG/3ML SOPN     Other   Smoker   Disc in detail risks of smoking and possible outcomes including copd, vascular/ heart disease, cancer , respiratory and sinus infections as well as osteoporosis  Pt voices understanding  Pt does not have desire to quit Also declines lung cancer screening       Overweight (BMI 25.0-29.9)   Bmi  now under 30  Commended Discussed how this problem influences overall health and the risks it imposes  Reviewed plan for weight loss with lower calorie diet (via better food choices (lower glycemic and portion control) along with exercise building up to or more than 30 minutes 5 days per week including some aerobic activity and strength training         Other Visit Diagnoses       Type 2 diabetes mellitus without complication, without long-term current use of insulin (HCC)       Relevant Medications   metFORMIN (GLUCOPHAGE-XR) 500 MG 24 hr tablet   Semaglutide, 2 MG/DOSE, (OZEMPIC, 2 MG/DOSE,) 8 MG/3ML SOPN   Other Relevant Orders   POCT HgB A1C (Completed)

## 2023-10-23 NOTE — Assessment & Plan Note (Signed)
 Lab Results  Component Value Date   HGBA1C 5.5 10/23/2023   HGBA1C 6.0 (A) 07/25/2023   HGBA1C 6.6 (H) 04/17/2023   Improved Will decrease metformin xr to 500 mg daily  Continue semaglutide 2 mg weekly /tolerating well with weight loss  No low glycose levels  Encouraged more strength building exercise and protein to avoid muscle loss  Sent for eye exam report  Microalb is utd and normal

## 2023-10-23 NOTE — Assessment & Plan Note (Signed)
 Disc in detail risks of smoking and possible outcomes including copd, vascular/ heart disease, cancer , respiratory and sinus infections as well as osteoporosis  Pt voices understanding  Pt does not have desire to quit Also declines lung cancer screening

## 2023-10-23 NOTE — Assessment & Plan Note (Signed)
 Bmi  now under 30  Commended Discussed how this problem influences overall health and the risks it imposes  Reviewed plan for weight loss with lower calorie diet (via better food choices (lower glycemic and portion control) along with exercise building up to or more than 30 minutes 5 days per week including some aerobic activity and strength training

## 2023-10-23 NOTE — Assessment & Plan Note (Signed)
 Disc goals for lipids and reasons to control them Rev last labs with pt Rev low sat fat diet in detail LDL of 72, controlled   Plan to continue atorvastatin 20 mg daily   Re check at annual exam

## 2023-10-23 NOTE — Assessment & Plan Note (Signed)
 bp in fair control at this time  BP Readings from Last 1 Encounters:  10/23/23 110/68   No changes needed Most recent labs reviewed  Disc lifstyle change with low sodium diet and exercise  Plan to continue hctz 25 mg daily  Losartan 100 mg daily

## 2024-04-16 ENCOUNTER — Telehealth: Payer: Self-pay | Admitting: Family Medicine

## 2024-04-16 DIAGNOSIS — E119 Type 2 diabetes mellitus without complications: Secondary | ICD-10-CM

## 2024-04-16 DIAGNOSIS — E1169 Type 2 diabetes mellitus with other specified complication: Secondary | ICD-10-CM

## 2024-04-16 DIAGNOSIS — I10 Essential (primary) hypertension: Secondary | ICD-10-CM

## 2024-04-16 NOTE — Telephone Encounter (Signed)
-----   Message from Veva JINNY Ferrari sent at 04/01/2024  3:56 PM EDT ----- Regarding: Lab orders for [facility], 9.4.25 Patient is scheduled for CPX labs, please order future labs, Thanks , Veva

## 2024-04-17 ENCOUNTER — Ambulatory Visit: Payer: Self-pay | Admitting: Family Medicine

## 2024-04-17 ENCOUNTER — Other Ambulatory Visit

## 2024-04-17 DIAGNOSIS — I10 Essential (primary) hypertension: Secondary | ICD-10-CM

## 2024-04-17 DIAGNOSIS — E119 Type 2 diabetes mellitus without complications: Secondary | ICD-10-CM

## 2024-04-17 DIAGNOSIS — Z7985 Long-term (current) use of injectable non-insulin antidiabetic drugs: Secondary | ICD-10-CM

## 2024-04-17 DIAGNOSIS — E1169 Type 2 diabetes mellitus with other specified complication: Secondary | ICD-10-CM | POA: Diagnosis not present

## 2024-04-17 DIAGNOSIS — E785 Hyperlipidemia, unspecified: Secondary | ICD-10-CM

## 2024-04-17 LAB — CBC WITH DIFFERENTIAL/PLATELET
Basophils Absolute: 0.1 K/uL (ref 0.0–0.1)
Basophils Relative: 0.8 % (ref 0.0–3.0)
Eosinophils Absolute: 0.2 K/uL (ref 0.0–0.7)
Eosinophils Relative: 2.4 % (ref 0.0–5.0)
HCT: 42.6 % (ref 36.0–46.0)
Hemoglobin: 14.3 g/dL (ref 12.0–15.0)
Lymphocytes Relative: 23 % (ref 12.0–46.0)
Lymphs Abs: 1.9 K/uL (ref 0.7–4.0)
MCHC: 33.5 g/dL (ref 30.0–36.0)
MCV: 90.8 fl (ref 78.0–100.0)
Monocytes Absolute: 0.9 K/uL (ref 0.1–1.0)
Monocytes Relative: 11.1 % (ref 3.0–12.0)
Neutro Abs: 5.1 K/uL (ref 1.4–7.7)
Neutrophils Relative %: 62.7 % (ref 43.0–77.0)
Platelets: 343 K/uL (ref 150.0–400.0)
RBC: 4.7 Mil/uL (ref 3.87–5.11)
RDW: 14.3 % (ref 11.5–15.5)
WBC: 8.1 K/uL (ref 4.0–10.5)

## 2024-04-17 LAB — COMPREHENSIVE METABOLIC PANEL WITH GFR
ALT: 8 U/L (ref 0–35)
AST: 12 U/L (ref 0–37)
Albumin: 3.8 g/dL (ref 3.5–5.2)
Alkaline Phosphatase: 88 U/L (ref 39–117)
BUN: 16 mg/dL (ref 6–23)
CO2: 29 meq/L (ref 19–32)
Calcium: 9 mg/dL (ref 8.4–10.5)
Chloride: 102 meq/L (ref 96–112)
Creatinine, Ser: 0.68 mg/dL (ref 0.40–1.20)
GFR: 89.75 mL/min (ref >60.00)
Glucose, Bld: 85 mg/dL (ref 70–99)
Potassium: 4.4 meq/L (ref 3.5–5.1)
Sodium: 140 meq/L (ref 135–145)
Total Bilirubin: 0.5 mg/dL (ref 0.2–1.2)
Total Protein: 6.2 g/dL (ref 6.0–8.3)

## 2024-04-17 LAB — LIPID PANEL
Cholesterol: 139 mg/dL (ref 0–200)
HDL: 44.8 mg/dL (ref >39.00)
LDL Cholesterol: 65 mg/dL (ref 0–99)
NonHDL: 93.91
Total CHOL/HDL Ratio: 3
Triglycerides: 146 mg/dL (ref 0.0–149.0)
VLDL: 29.2 mg/dL (ref 0.0–40.0)

## 2024-04-17 LAB — MICROALBUMIN / CREATININE URINE RATIO
Creatinine,U: 153.4 mg/dL
Microalb Creat Ratio: 18.7 mg/g (ref 0.0–30.0)
Microalb, Ur: 2.9 mg/dL — ABNORMAL HIGH (ref 0.0–1.9)

## 2024-04-17 LAB — HEMOGLOBIN A1C: Hgb A1c MFr Bld: 5.9 % (ref 4.6–6.5)

## 2024-04-17 LAB — TSH: TSH: 4.58 u[IU]/mL (ref 0.35–5.50)

## 2024-04-24 ENCOUNTER — Encounter: Admitting: Family Medicine

## 2024-04-26 ENCOUNTER — Other Ambulatory Visit: Payer: Self-pay | Admitting: Family Medicine

## 2024-04-26 DIAGNOSIS — F418 Other specified anxiety disorders: Secondary | ICD-10-CM

## 2024-05-23 ENCOUNTER — Other Ambulatory Visit: Payer: Self-pay | Admitting: Family Medicine

## 2024-05-23 NOTE — Telephone Encounter (Signed)
 Name of Medication: Ambien  Name of Pharmacy: [facility] Prior Last Fill or Written Date and Quantity:10/04/23 #30 tab/ 3 refills   Last Office Visit and Type: f/u 10/23/23 Next Office Visit and Type:  CPE 05/27/24

## 2024-05-27 ENCOUNTER — Ambulatory Visit: Admitting: Family Medicine

## 2024-05-27 ENCOUNTER — Encounter: Payer: Self-pay | Admitting: Family Medicine

## 2024-05-27 VITALS — BP 96/58 | HR 60 | Temp 97.7°F | Ht 65.0 in | Wt 176.4 lb

## 2024-05-27 DIAGNOSIS — I10 Essential (primary) hypertension: Secondary | ICD-10-CM

## 2024-05-27 DIAGNOSIS — Z1211 Encounter for screening for malignant neoplasm of colon: Secondary | ICD-10-CM

## 2024-05-27 DIAGNOSIS — E785 Hyperlipidemia, unspecified: Secondary | ICD-10-CM | POA: Diagnosis not present

## 2024-05-27 DIAGNOSIS — F5101 Primary insomnia: Secondary | ICD-10-CM

## 2024-05-27 DIAGNOSIS — F17200 Nicotine dependence, unspecified, uncomplicated: Secondary | ICD-10-CM | POA: Diagnosis not present

## 2024-05-27 DIAGNOSIS — Z1231 Encounter for screening mammogram for malignant neoplasm of breast: Secondary | ICD-10-CM | POA: Diagnosis not present

## 2024-05-27 DIAGNOSIS — F418 Other specified anxiety disorders: Secondary | ICD-10-CM

## 2024-05-27 DIAGNOSIS — E1169 Type 2 diabetes mellitus with other specified complication: Secondary | ICD-10-CM

## 2024-05-27 DIAGNOSIS — Z7985 Long-term (current) use of injectable non-insulin antidiabetic drugs: Secondary | ICD-10-CM

## 2024-05-27 DIAGNOSIS — E2839 Other primary ovarian failure: Secondary | ICD-10-CM | POA: Diagnosis not present

## 2024-05-27 DIAGNOSIS — E663 Overweight: Secondary | ICD-10-CM

## 2024-05-27 DIAGNOSIS — Z0000 Encounter for general adult medical examination without abnormal findings: Secondary | ICD-10-CM | POA: Diagnosis not present

## 2024-05-27 DIAGNOSIS — E119 Type 2 diabetes mellitus without complications: Secondary | ICD-10-CM

## 2024-05-27 MED ORDER — HYDROCHLOROTHIAZIDE 12.5 MG PO CAPS: 12.5000 mg | ORAL_CAPSULE | Freq: Every day | ORAL | 3 refills | Status: AC

## 2024-05-27 NOTE — Assessment & Plan Note (Signed)
 Colonoscopy 06/2023 with 3 y recall

## 2024-05-27 NOTE — Assessment & Plan Note (Signed)
 Continues paxil  40 mg daily  PHQ 0  Wants to continue this  Encouraged self care

## 2024-05-27 NOTE — Progress Notes (Signed)
 Subjective:    Patient ID: Kathleen Hernandez, female    DOB: 1956/03/01, 68 y.o.   MRN: [id number]  HPI  Here for health maintenance exam and to review chronic medical problems   Wt Readings from Last 3 Encounters:  05/27/24 176 lb 6 oz (80 kg)  10/23/23 177 lb 2 oz (80.3 kg)  09/18/23 183 lb 9.6 oz (83.3 kg)   29.35 kg/m  Vitals:   05/27/24 1523  BP: (!) 96/58  Pulse: 60  Temp: 97.7 F (36.5 C)  SpO2: 95%    Immunization History  Administered Date(s) Administered   Fluad Quad(high Dose 65+) 04/29/2019   Influenza,inj,Quad PF,6+ Mos 08/23/2018   Influenza-Unspecified 05/28/2017   Pneumococcal Polysaccharide-23 10/08/2017   Tdap 10/08/2017    Health Maintenance Due  Topic Date Due   Lung Cancer Screening  Never done   Pneumococcal Vaccine: 50+ Years (2 of 2 - PCV) 10/08/2018   OPHTHALMOLOGY EXAM  05/14/2022   Mammogram  05/31/2024    Flu shot -declines   Shingrix -declines   Eye exam utd-sent for report   Mammogram 05/2023  Self breast exam-no lumps   Gyn health No problems  More night sweats    Colon cancer screening  Colonoscopy 06/2023 with 3 y recall   Bone health  Dexa 05/2022 osteopenia  Falls-none  Fractures-none  Supplements -has not been taking her vitamin D   Exercise - more in the summer   Smoking status  1 ppd Not planning to quit  Declines lung cancer screening   Mood    10/23/2023    8:58 AM 09/18/2023    3:52 PM 07/25/2023    8:09 AM 04/23/2023    8:53 AM 11/24/2022    2:05 PM  Depression screen PHQ 2/9  Decreased Interest 0 0 0 0 3  Down, Depressed, Hopeless 0 0 0 0 0  PHQ - 2 Score 0 0 0 0 3  Altered sleeping 0  0 0 3  Tired, decreased energy 0  0 1 3  Change in appetite 0  0 0 1  Feeling bad or failure about yourself  0  0 0 0  Trouble concentrating 0  0 0 0  Moving slowly or fidgety/restless 0  0 0 0  Suicidal thoughts 0  0 0 0  PHQ-9 Score 0  0 1 10  Difficult doing work/chores Not difficult at all Not  difficult at all Not difficult at all Not difficult at all Somewhat difficult  Depression/anxiety  Paxil  40 mg daily -works well for her  Ambien  for sleep -no falls   HTN bp is stable today  No cp or palpitations or headaches or edema  No side effects to medicines  BP Readings from Last 3 Encounters:  05/27/24 (!) 96/58  10/23/23 110/68  09/18/23 118/70     Lab Results  Component Value Date   NA 140 04/17/2024   K 4.4 04/17/2024   CO2 29 04/17/2024   GLUCOSE 85 04/17/2024   BUN 16 04/17/2024   CREATININE 0.68 04/17/2024   CALCIUM  9.0 04/17/2024   GFR 89.75 04/17/2024   GFRNONAA >60 02/02/2021   Hydrochlorothiazide  25 mg daily  Losartan  100 mg daily    DM2 Lab Results  Component Value Date   HGBA1C 5.9 04/17/2024   HGBA1C 5.5 10/23/2023   HGBA1C 6.0 (A) 07/25/2023   Metformin  xr 500 mg daily  Semaglutide  2 mg weekly Sent for eye exam report/likely needs an eye exam   Lab  Results  Component Value Date   MICROALBUR 2.9 (H) 04/17/2024  Ratio is normal    Hyperlipidemia Lab Results  Component Value Date   CHOL 139 04/17/2024   CHOL 141 04/17/2023   CHOL 143 04/10/2022   Lab Results  Component Value Date   HDL 44.80 04/17/2024   HDL 40.70 04/17/2023   HDL 44.80 04/10/2022   Lab Results  Component Value Date   LDLCALC 65 04/17/2024   LDLCALC 72 04/17/2023   LDLCALC 74 04/10/2022   Lab Results  Component Value Date   TRIG 146.0 04/17/2024   TRIG 140.0 04/17/2023   TRIG 122.0 04/10/2022   Lab Results  Component Value Date   CHOLHDL 3 04/17/2024   CHOLHDL 3 04/17/2023   CHOLHDL 3 04/10/2022   No results found for: LDLDIRECT Atorvastatin  20 mg daily   Eating healthy   Lab Results  Component Value Date   ALT 8 04/17/2024   AST 12 04/17/2024   ALKPHOS 88 04/17/2024   BILITOT 0.5 04/17/2024    Lab Results  Component Value Date   TSH 4.58 04/17/2024   Lab Results  Component Value Date   WBC 8.1 04/17/2024   HGB 14.3 04/17/2024   HCT  42.6 04/17/2024   MCV 90.8 04/17/2024   PLT 343.0 04/17/2024      Patient Active Problem List   Diagnosis Date Noted   Polyp of descending colon 06/25/2023   Polyp of sigmoid colon 06/25/2023   Colon cancer screening 04/23/2023   Routine general medical examination at a health care facility 04/19/2022   Estrogen deficiency 04/19/2022   Low back pain 03/01/2021   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   PVC (premature ventricular contraction) 07/21/2020   Knee pain, bilateral 02/10/2020   Mixed incontinence urge and stress 02/10/2020   Encounter for general adult medical examination with abnormal findings 08/05/2019   Diabetes mellitus treated with injections of non-insulin medication (HCC) 08/23/2018   Hyperlipidemia associated with type 2 diabetes mellitus (HCC) 08/31/2017   Smoker 08/31/2017   Hypertension 08/31/2017   Encounter for screening mammogram for breast cancer 08/31/2017   Overweight (BMI 25.0-29.9) 08/31/2017   Depression with anxiety 08/31/2017   Insomnia 08/31/2017   Past Medical History:  Diagnosis Date   Depression    Diabetes mellitus without complication (HCC)    Family history of heart murmur    History of chickenpox    History of colon polyps    History of depression    History of gastroesophageal reflux (GERD)    History of hyperlipidemia    History of urinary incontinence    Hyperlipidemia    Hypertension    Past Surgical History:  Procedure Laterality Date   BREAST BIOPSY Right 2000   neg   BREAST REDUCTION SURGERY  2010   COLONOSCOPY WITH PROPOFOL  N/A 06/25/2023   Procedure: COLONOSCOPY WITH PROPOFOL ;  Surgeon: Unk Corinn Skiff, MD;  Location: [facility];  Service: Gastroenterology;  Laterality: N/A;   GALLBLADDER SURGERY  2009   INCONTINENCE SURGERY  2009   PARTIAL HYSTERECTOMY  2009   POLYPECTOMY  06/25/2023   Procedure: POLYPECTOMY;  Surgeon: Unk Corinn Skiff, MD;  Location: [facility];  Service:  Gastroenterology;;   REDUCTION MAMMAPLASTY Bilateral 2000   Social History   Tobacco Use   Smoking status: Every Day    Current packs/day: 1.00    Average packs/day: 1 pack/day for 41.0 years (41.0 ttl pk-yrs)    Types: Cigarettes   Smokeless tobacco: Never  Vaping  Use   Vaping status: Never Used  Substance Use Topics   Alcohol use: No   Drug use: No   Family History  Problem Relation Age of Onset   Arthritis Mother    Heart attack Mother    Early death Mother    Alcohol abuse Father    Alzheimer's disease Father    Non-Hodgkin's lymphoma Sister    Depression Sister    Diabetes Maternal Grandmother    Alcohol abuse Maternal Grandfather    Alzheimer's disease Paternal Grandmother    Alcohol abuse Brother    COPD Brother    Heart attack Brother    Hyperlipidemia Brother    Drug abuse Son    Cancer Sister    Breast cancer Neg Hx    Allergies  Allergen Reactions   Requip [Ropinirole Hcl] Other (See Comments)    Tongue swelling   Chantix [Varenicline] Rash   Current Outpatient Medications on File Prior to Visit  Medication Sig Dispense Refill   ACCU-CHEK FASTCLIX LANCETS MISC Check sugar once daily and as needed for diabetes. DX: E11.9 90 each 3   aspirin 325 MG tablet Take 325 mg by mouth daily.     atorvastatin  (LIPITOR) 20 MG tablet Take 1 tablet (20 mg total) by mouth daily. 90 tablet 3   Cholecalciferol (VITAMIN D3) 25 MCG (1000 UT) CAPS Take 1 capsule by mouth daily.     EPINEPHrine  (EPIPEN  2-PAK) 0.3 mg/0.3 mL IJ SOAJ injection Inject 0.3 mg into the muscle as needed for anaphylaxis. 1 each 0   glucose blood (ACCU-CHEK GUIDE) test strip USE TO CHECK BLOOD SUGAR ONCE DAILY AND AS NEEDED FOR DM (DX. E11.9) 100 strip 1   losartan  (COZAAR ) 100 MG tablet Take 1 tablet (100 mg total) by mouth daily. 90 tablet 3   metFORMIN  (GLUCOPHAGE -XR) 500 MG 24 hr tablet Take 2 tablets (1,000 mg total) by mouth daily with breakfast. 90 tablet 3   PARoxetine  (PAXIL ) 40 MG tablet  TAKE 1 TABLET BY MOUTH EVERY DAY 90 tablet 0   Semaglutide , 2 MG/DOSE, (OZEMPIC , 2 MG/DOSE,) 8 MG/3ML SOPN INJECT 2 MG AS DIRECTED ONCE A WEEK. 3 mL 6   zolpidem  (AMBIEN ) 10 MG tablet TAKE 1 TABLET BY MOUTH EVERY NIGHT AT BEDTIME AS NEEDED FOR SLEEP 30 tablet 3   No current facility-administered medications on file prior to visit.    Review of Systems  Constitutional:  Negative for activity change, appetite change, fatigue, fever and unexpected weight change.  HENT:  Negative for congestion, ear pain, rhinorrhea, sinus pressure and sore throat.   Eyes:  Negative for pain, redness and visual disturbance.  Respiratory:  Negative for cough, shortness of breath and wheezing.   Cardiovascular:  Negative for chest pain and palpitations.  Gastrointestinal:  Negative for abdominal pain, blood in stool, constipation and diarrhea.  Endocrine: Negative for polydipsia and polyuria.  Genitourinary:  Negative for dysuria, frequency and urgency.  Musculoskeletal:  Negative for arthralgias, back pain and myalgias.  Skin:  Negative for pallor and rash.  Allergic/Immunologic: Negative for environmental allergies.  Neurological:  Negative for dizziness, syncope and headaches.  Hematological:  Negative for adenopathy. Does not bruise/bleed easily.  Psychiatric/Behavioral:  Negative for decreased concentration and dysphoric mood. The patient is not nervous/anxious.        Objective:   Physical Exam Constitutional:      General: She is not in acute distress.    Appearance: Normal appearance. She is well-developed. She is not ill-appearing or diaphoretic.  Comments: Overweight   HENT:     Head: Normocephalic and atraumatic.     Right Ear: Tympanic membrane, ear canal and external ear normal.     Left Ear: Tympanic membrane, ear canal and external ear normal.     Nose: Nose normal. No congestion.     Mouth/Throat:     Mouth: Mucous membranes are moist.     Pharynx: Oropharynx is clear. No posterior  oropharyngeal erythema.  Eyes:     General: No scleral icterus.    Extraocular Movements: Extraocular movements intact.     Conjunctiva/sclera: Conjunctivae normal.     Pupils: Pupils are equal, round, and reactive to light.  Neck:     Thyroid : No thyromegaly.     Vascular: No carotid bruit or JVD.  Cardiovascular:     Rate and Rhythm: Normal rate and regular rhythm.     Pulses: Normal pulses.     Heart sounds: Murmur heard.     No gallop.  Pulmonary:     Effort: Pulmonary effort is normal. No respiratory distress.     Breath sounds: Normal breath sounds. No wheezing.     Comments: Good air exch Chest:     Chest wall: No tenderness.  Abdominal:     General: Bowel sounds are normal. There is no distension or abdominal bruit.     Palpations: Abdomen is soft. There is no mass.     Tenderness: There is no abdominal tenderness.     Hernia: No hernia is present.  Genitourinary:    Comments: Breast exam: No mass, nodules, thickening, tenderness, bulging, retraction, inflamation, nipple discharge or skin changes noted.  No axillary or clavicular LA.     Musculoskeletal:        General: No tenderness. Normal range of motion.     Cervical back: Normal range of motion and neck supple. No rigidity. No muscular tenderness.     Right lower leg: No edema.     Left lower leg: No edema.     Comments: No kyphosis   Lymphadenopathy:     Cervical: No cervical adenopathy.  Skin:    General: Skin is warm and dry.     Coloration: Skin is not pale.     Findings: No erythema or rash.     Comments: Solar lentigines diffusely  Some solar aging   Many healed insect bites with scabs on dorsal feet and anterior lower legs None look infected   Neurological:     Mental Status: She is alert. Mental status is at baseline.     Cranial Nerves: No cranial nerve deficit.     Motor: No abnormal muscle tone.     Coordination: Coordination normal.     Gait: Gait normal.     Deep Tendon Reflexes: Reflexes  are normal and symmetric. Reflexes normal.  Psychiatric:        Mood and Affect: Mood normal.        Cognition and Memory: Cognition and memory normal.           Assessment & Plan:   Problem List Items Addressed This Visit       Cardiovascular and Mediastinum   Hypertension   BP: (!) 96/58  Blood pressure is approaching too low (no light headedness) Plan to cut hydrochlorothiazide  from 25 mg to 12.5 mg and report back  Continue losartan  100 mg daily  Labs reviewed Encouraged smoking cessation   Follow up 6 mo       Relevant Medications  hydrochlorothiazide  (MICROZIDE ) 12.5 MG capsule     Endocrine   Hyperlipidemia associated with type 2 diabetes mellitus (HCC)   Disc goals for lipids and reasons to control them Rev last labs with pt Rev low sat fat diet in detail  Atorvastatin  20 mg daily  Good control  LDL 65       Relevant Medications   hydrochlorothiazide  (MICROZIDE ) 12.5 MG capsule   Diabetes mellitus treated with injections of non-insulin medication (HCC)   Lab Results  Component Value Date   HGBA1C 5.9 04/17/2024   HGBA1C 5.5 10/23/2023   HGBA1C 6.0 (A) 07/25/2023   Normal microalb ratio 04/17/24  Plan to continue Metfomin xr 500 mg daily  Semaglutide  2 mg weekly  Sent for eye exam report /she will schedule  Normal foot exam  Follow up 6 mo          Other   Smoker   Disc in detail risks of smoking and possible outcomes including copd, vascular/ heart disease, cancer , respiratory and sinus infections as well as osteoporosis  Pt voices understanding Pt is not interested in quitting at this time       Routine general medical examination at a health care facility - Primary   Reviewed health habits including diet and exercise and skin cancer prevention Reviewed appropriate screening tests for age  Also reviewed health mt list, fam hx and immunization status , as well as social and family history   See HPI Labs reviewed and ordered Health  Maintenance  Topic Date Due   Screening for Lung Cancer  Never done   Pneumococcal Vaccine for age over 16 (2 of 2 - PCV) 10/08/2018   Eye exam for diabetics  05/14/2022   Breast Cancer Screening  05/31/2024   Zoster (Shingles) Vaccine (1 of 2) 10/22/2024*   Flu Shot  11/11/2024*   COVID-19 Vaccine (1 - 2025-26 season) 06/12/2025*   Hepatitis C Screening  10/09/2027*   Complete foot exam   07/24/2024   Medicare Annual Wellness Visit  09/17/2024   Hemoglobin A1C  10/15/2024   Yearly kidney function blood test for diabetes  04/17/2025   Yearly kidney health urinalysis for diabetes  04/17/2025   Colon Cancer Screening  06/24/2026   DTaP/Tdap/Td vaccine (2 - Td or Tdap) 10/09/2027   DEXA scan (bone density measurement)  Completed   Meningitis B Vaccine  Aged Out  *Topic was postponed. The date shown is not the original due date.    Declines flu and shingrix vaccines Sent for oph report  Mammo and dexa ordered  Discussed fall prevention, supplements and exercise for bone density  Counseled on smoking cessation  PHQ 0       Overweight (BMI 25.0-29.9)   Discussed how this problem influences overall health and the risks it imposes  Reviewed plan for weight loss with lower calorie diet (via better food choices (lower glycemic and portion control) along with exercise building up to or more than 30 minutes 5 days per week including some aerobic activity and strength training   Cannot continue glp-1 unless she begins strength training to prevent muscle loss/ pt aware and she plans to start         Insomnia   ambien  nightly  Disc risl of habit/sedation and falls        Estrogen deficiency   Dexa ordered Pt will call to schedule       Relevant Orders   DG Bone Density   Encounter for screening mammogram  for breast cancer   Mammogram ordered Pt will call to schedule       Relevant Orders   MM 3D SCREENING MAMMOGRAM BILATERAL BREAST   Depression with anxiety   Continues  paxil  40 mg daily  PHQ 0  Wants to continue this  Encouraged self care       Colon cancer screening   Colonoscopy 06/2023 with 3 y recall       Other Visit Diagnoses       Type 2 diabetes mellitus without complication, without long-term current use of insulin (HCC)

## 2024-05-27 NOTE — Assessment & Plan Note (Signed)
 Lab Results  Component Value Date   HGBA1C 5.9 04/17/2024   HGBA1C 5.5 10/23/2023   HGBA1C 6.0 (A) 07/25/2023   Normal microalb ratio 04/17/24  Plan to continue Metfomin xr 500 mg daily  Semaglutide  2 mg weekly  Sent for eye exam report /she will schedule  Normal foot exam  Follow up 6 mo

## 2024-05-27 NOTE — Assessment & Plan Note (Signed)
Dexa ordered Pt will call to schedule

## 2024-05-27 NOTE — Patient Instructions (Addendum)
 Aim for 2000 international units of vitamin D3 daily for bone health over the counter   Please start some strength training exercise to prevent muscle loss from the ozempic  and bone loss Add some strength training to your routine, this is important for bone and brain health and can reduce your risk of falls and help your body use insulin properly and regulate weight  Light weights, exercise bands , and internet videos are a good way to start  Yoga (chair or regular), machines , floor exercises or a gym with machines are also good options    Stop the hydrochlorothiazide  25 mg daily and start the lower dose of 12.5 mg daily for blood pressure  Your blood pressure was on the low side    You have an order for:  [x]   3D Mammogram  [x]   Bone Density     Please call for appointment:   [x]   [facility]  [address] [place] [address]  [phone]  []   [facility])   [address]  [place], [place] [address]  [phone]  []   [facility]      [address] [place], [place]        [phone]         []   [facility]  [address] [place], [place]  [phone]  []  [facility]   [address]   [place], [place] [address]  [phone]  []  [facility]  [address] [place], [place] [address] [phone]    Make sure to wear two piece clothing  No lotions powders or deodorants the day of the appointment Make sure to bring picture ID and insurance card.  Bring list of medications you are currently taking including any supplements.   Schedule your screening mammogram through MyChart!   Select [facility] imaging sites can now be scheduled through MyChart.  Log into your MyChart account.  Go to 'Visit' (or  'Appointments' if  on mobile App) --> Schedule an  Appointment  Under 'Select a Reason for Visit' choose the Mammogram  Screening option.  Complete the pre-visit questions  and select the time and place that  best fits your schedule

## 2024-05-27 NOTE — Assessment & Plan Note (Addendum)
 Discussed how this problem influences overall health and the risks it imposes  Reviewed plan for weight loss with lower calorie diet (via better food choices (lower glycemic and portion control) along with exercise building up to or more than 30 minutes 5 days per week including some aerobic activity and strength training   Cannot continue glp-1 unless she begins strength training to prevent muscle loss/ pt aware and she plans to start

## 2024-05-27 NOTE — Assessment & Plan Note (Signed)
 Mammogram ordered Pt will call to schedule

## 2024-05-27 NOTE — Assessment & Plan Note (Signed)
 Disc in detail risks of smoking and possible outcomes including copd, vascular/ heart disease, cancer , respiratory and sinus infections as well as osteoporosis  Pt voices understanding Pt is not interested in quitting at this time

## 2024-05-27 NOTE — Assessment & Plan Note (Signed)
ambien nightly  Disc risl of habit/sedation and falls

## 2024-05-27 NOTE — Assessment & Plan Note (Signed)
 BP: (!) 96/58  Blood pressure is approaching too low (no light headedness) Plan to cut hydrochlorothiazide  from 25 mg to 12.5 mg and report back  Continue losartan  100 mg daily  Labs reviewed Encouraged smoking cessation   Follow up 6 mo

## 2024-05-27 NOTE — Assessment & Plan Note (Signed)
 Reviewed health habits including diet and exercise and skin cancer prevention Reviewed appropriate screening tests for age  Also reviewed health mt list, fam hx and immunization status , as well as social and family history   See HPI Labs reviewed and ordered Health Maintenance  Topic Date Due   Screening for Lung Cancer  Never done   Pneumococcal Vaccine for age over 32 (2 of 2 - PCV) 10/08/2018   Eye exam for diabetics  05/14/2022   Breast Cancer Screening  05/31/2024   Zoster (Shingles) Vaccine (1 of 2) 10/22/2024*   Flu Shot  11/11/2024*   COVID-19 Vaccine (1 - 2025-26 season) 06/12/2025*   Hepatitis C Screening  10/09/2027*   Complete foot exam   07/24/2024   Medicare Annual Wellness Visit  09/17/2024   Hemoglobin A1C  10/15/2024   Yearly kidney function blood test for diabetes  04/17/2025   Yearly kidney health urinalysis for diabetes  04/17/2025   Colon Cancer Screening  06/24/2026   DTaP/Tdap/Td vaccine (2 - Td or Tdap) 10/09/2027   DEXA scan (bone density measurement)  Completed   Meningitis B Vaccine  Aged Out  *Topic was postponed. The date shown is not the original due date.    Declines flu and shingrix vaccines Sent for oph report  Mammo and dexa ordered  Discussed fall prevention, supplements and exercise for bone density  Counseled on smoking cessation  PHQ 0

## 2024-05-27 NOTE — Assessment & Plan Note (Signed)
 Disc goals for lipids and reasons to control them Rev last labs with pt Rev low sat fat diet in detail  Atorvastatin  20 mg daily  Good control  LDL 65

## 2024-06-11 NOTE — Progress Notes (Signed)
 Kathleen Hernandez                                          MRN: [id number]   06/11/2024   The [facility] Quality Team Specialist reviewed this patient medical record for the purposes of chart review for care gap closure. The following were reviewed: chart review for care gap closure-diabetic eye exam.    VBCI Quality Team

## 2024-06-19 ENCOUNTER — Other Ambulatory Visit: Payer: Self-pay | Admitting: Family Medicine

## 2024-06-19 ENCOUNTER — Encounter: Payer: Self-pay | Admitting: Pharmacist

## 2024-06-19 MED ORDER — METFORMIN HCL ER 500 MG PO TB24: 500.0000 mg | ORAL_TABLET | Freq: Every day | ORAL | 0 refills | Status: DC

## 2024-06-19 NOTE — Progress Notes (Signed)
 Pharmacy Quality Measure Review  This patient is appearing on a report for being at risk of failing the adherence measure for diabetes medications this calendar year.   Medication: metformin  XR 500 mg Last fill date: 5/26 for 90 day supply  Per chart notes, metformin  dose was reduced to 1 tablet daily though no new Rx was sent.  Will collaborate with provider to facilitate refill needs. New Rx so patient does not continue to be at risk of failing this THN measure in 2026.

## 2024-06-19 NOTE — Progress Notes (Signed)
 I sent it for 500 mg daily  Thanks

## 2024-06-30 ENCOUNTER — Ambulatory Visit
Admission: RE | Admit: 2024-06-30 | Discharge: 2024-06-30 | Disposition: A | Discharge status: Home / Self Care | Source: Ambulatory Visit | Attending: Family Medicine | Admitting: Family Medicine

## 2024-06-30 ENCOUNTER — Ambulatory Visit: Payer: Self-pay | Admitting: Family Medicine

## 2024-06-30 DIAGNOSIS — M8589 Other specified disorders of bone density and structure, multiple sites: Secondary | ICD-10-CM

## 2024-06-30 DIAGNOSIS — M8580 Other specified disorders of bone density and structure, unspecified site: Secondary | ICD-10-CM | POA: Insufficient documentation

## 2024-06-30 DIAGNOSIS — Z1231 Encounter for screening mammogram for malignant neoplasm of breast: Secondary | ICD-10-CM | POA: Diagnosis present

## 2024-06-30 DIAGNOSIS — E2839 Other primary ovarian failure: Secondary | ICD-10-CM | POA: Diagnosis present

## 2024-07-02 ENCOUNTER — Other Ambulatory Visit: Payer: Self-pay | Admitting: Family Medicine

## 2024-07-11 ENCOUNTER — Encounter: Payer: Self-pay | Admitting: Pharmacist

## 2024-07-11 NOTE — Progress Notes (Signed)
 Pharmacy Quality Measure Review  This patient is appearing on a report for being at risk of failing the adherence measure for diabetes medications this calendar year.   Medication: Ozempic  2 mg Last fill date: 05/19/24 for 28 day supply  Insurance report was not up to date. No action needed at this time.  Medication has been refilled as of 06/19/24 x28 ds.  3 additional refills remaking.  Next refill due ~07/19/24. Reminder set.

## 2024-07-16 ENCOUNTER — Other Ambulatory Visit (HOSPITAL_COMMUNITY): Payer: Self-pay

## 2024-07-17 ENCOUNTER — Other Ambulatory Visit (HOSPITAL_COMMUNITY): Payer: Self-pay

## 2024-07-17 ENCOUNTER — Telehealth: Payer: Self-pay

## 2024-07-17 NOTE — Telephone Encounter (Signed)
 Pharmacy Patient Advocate Encounter   Received notification from Onbase that prior authorization for Ozempic  8 is required/requested.   Insurance verification completed.   The patient is insured through [facility].   Per test claim: Refill too soon. PA is not needed at this time. Medication was filled 07/15/24. Next eligible fill date is 08/05/24.   Patient has current approved PA that expires 08/13/24. Will resubmit closer to that time.

## 2024-07-21 ENCOUNTER — Other Ambulatory Visit (HOSPITAL_COMMUNITY): Payer: Self-pay

## 2024-07-23 ENCOUNTER — Other Ambulatory Visit: Payer: Self-pay | Admitting: Family Medicine

## 2024-07-23 DIAGNOSIS — F418 Other specified anxiety disorders: Secondary | ICD-10-CM

## 2024-07-30 ENCOUNTER — Encounter: Payer: Self-pay | Admitting: Family Medicine

## 2024-07-31 NOTE — Telephone Encounter (Signed)
 Is she sick?  If so triage or make appointment  Thanks  If chronic cough let me know

## 2024-09-10 ENCOUNTER — Other Ambulatory Visit (HOSPITAL_COMMUNITY): Payer: Self-pay

## 2024-09-11 ENCOUNTER — Other Ambulatory Visit (HOSPITAL_COMMUNITY): Payer: Self-pay

## 2024-09-11 ENCOUNTER — Telehealth: Payer: Self-pay

## 2024-09-11 NOTE — Telephone Encounter (Signed)
 See prev note. PA was approved but almost $700, FYI to PCP

## 2024-09-11 NOTE — Telephone Encounter (Signed)
 Pharmacy Patient Advocate Encounter  Received notification from [facility] that Prior Authorization for Ozempic  8 has been APPROVED from 09/11/24 to 09/11/25. Ran test claim, Copay is $681.90. This test claim was processed through [facility]- copay amounts may vary at other pharmacies due to pharmacy/plan contracts, or as the patient moves through the different stages of their insurance plan.   PA #/Case ID/Reference #: # [id number]

## 2024-09-11 NOTE — Telephone Encounter (Signed)
 Pharmacy Patient Advocate Encounter   Received notification from [facility] KEY that prior authorization for Ozempic  8 is required/requested.   Insurance verification completed.   The patient is insured through [facility].   Per test claim: PA required; PA submitted to above mentioned insurance via Latent Key/confirmation #/[id number] Status is pending

## 2024-09-12 NOTE — Telephone Encounter (Signed)
 Would she be open to referral to pharmacy services ?

## 2024-09-13 ENCOUNTER — Other Ambulatory Visit: Payer: Self-pay | Admitting: Family Medicine

## 2024-09-18 ENCOUNTER — Ambulatory Visit: Payer: Medicare Other

## 2024-09-19 ENCOUNTER — Other Ambulatory Visit: Payer: Self-pay | Admitting: Family Medicine

## 2024-09-19 ENCOUNTER — Ambulatory Visit

## 2024-09-19 NOTE — Telephone Encounter (Signed)
Sent message asking pt 

## 2024-11-25 ENCOUNTER — Ambulatory Visit: Admitting: Family Medicine
